# Patient Record
Sex: Male | Born: 1990 | Race: Black or African American | Hispanic: No | Marital: Single | Smoking: Never smoker
Health system: Southern US, Community
[De-identification: ages and names within clinical notes are randomized; demographics above are authoritative.]

## PROBLEM LIST (undated history)

## (undated) DIAGNOSIS — K219 Gastro-esophageal reflux disease without esophagitis: Secondary | ICD-10-CM

## (undated) DIAGNOSIS — J45909 Unspecified asthma, uncomplicated: Secondary | ICD-10-CM

## (undated) HISTORY — DX: Gastro-esophageal reflux disease without esophagitis: K21.9

## (undated) HISTORY — DX: Unspecified asthma, uncomplicated: J45.909

---
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
## 2010-04-21 ENCOUNTER — Ambulatory Visit: Payer: Self-pay | Admitting: Internal Medicine

## 2010-05-30 ENCOUNTER — Ambulatory Visit: Payer: Self-pay | Admitting: Internal Medicine

## 2010-05-30 DIAGNOSIS — M79609 Pain in unspecified limb: Secondary | ICD-10-CM

## 2010-08-21 NOTE — Letter (Signed)
Summary: Lipid Letter  [facility]  [address] [place], [place] [address]   Phone: [phone]  Fax: [phone]    12/02/2009  Alex Hernandez [address] [place], [place]  [address]  Dear Alex:  We have carefully reviewed your last lipid profile from 12/02/2009 and the results are noted below with a summary of recommendations for lipid management.    Cholesterol:       156     Goal: <200   HDL "good" Cholesterol:   13.24     Goal: >40   LDL "bad" Cholesterol:   98     Goal: <130   Triglycerides:       61.0     Goal: <150    excellent results    TLC Diet (Therapeutic Lifestyle Change): Saturated Fats & Transfatty acids should be kept < 7% of total calories ***Reduce Saturated Fats Polyunstaurated Fat can be up to 10% of total calories Monounsaturated Fat Fat can be up to 20% of total calories Total Fat should be no greater than 25-35% of total calories Carbohydrates should be 50-60% of total calories Protein should be approximately 15% of total calories Fiber should be at least 20-30 grams a day ***Increased fiber may help lower LDL Total Cholesterol should be < 200mg /day Consider adding plant stanol/sterols to diet (example: Benacol spread) ***A higher intake of unsaturated fat may reduce Triglycerides and Increase HDL    Adjunctive Measures (may lower LIPIDS and reduce risk of Heart Attack) include: Aerobic Exercise (20-30 minutes 3-4 times a week) Limit Alcohol Consumption Weight Reduction Aspirin 75-81 mg a day by mouth (if not allergic or contraindicated) Dietary Fiber 20-30 grams a day by mouth     Current Medications:  None If you have any questions, please call. We appreciate being able to work with you.   Sincerely,    [facility] Etta Grandchild MD

## 2010-08-21 NOTE — Assessment & Plan Note (Signed)
Summary: ONE MONTH FOLLOW UP-LB   Vital Signs:  Patient profile:   20 year old male Height:      68 inches Weight:      148 pounds BMI:     22.58 Temp:     99.2 degrees F oral Pulse rate:   72 / minute Pulse rhythm:   regular Resp:     16 per minute BP sitting:   102 / 70  (left arm) Cuff size:   regular  Vitals Entered By: Lanier Prude, CMA(AAMA) (May 30, 2010 3:04 PM) CC: 1 mo f/u c/o Rt knee/leg pain while driving X 1 mo Is Patient Diabetic? No   Primary Care Provider:  Etta Grandchild MD  CC:  1 mo f/u c/o Rt knee/leg pain while driving X 1 mo.  History of Present Illness: F/u GERD - better C/o pain on the outer side of the back of his knee worse w/driving x 1 month  Current Medications (verified): 1)  Vitamin D 1000 Unit Tabs (Cholecalciferol) .Marland Kitchen.. 1 By Mouth Qd 2)  Pantoprazole Sodium 40 Mg Tbec (Pantoprazole Sodium) .Marland Kitchen.. 1 By Mouth Once Daily For Indigestion  Allergies (verified): 1)  ! Penicillin  Past History:  Past Medical History: Last updated: 04/21/2010  GERD 2011  Social History: Last updated: 04/21/2010 Single Never Smoked Alcohol use-no Drug use-no Regular exercise-no Occupation:[employer] student  Review of Systems  The patient denies fever, chest pain, abdominal pain, and difficulty walking.    Physical Exam  General:  alert, well-developed, well-nourished, well-hydrated, appropriate dress, normal appearance, healthy-appearing, cooperative to examination, and good hygiene.   Mouth:  Oral mucosa and oropharynx without lesions or exudates.  Teeth in good repair. Lungs:  Normal respiratory effort, chest expands symmetrically. Lungs are clear to auscultation, no crackles or wheezes. Heart:  Normal rate and regular rhythm. S1 and S2 normal without gallop, murmur, click, rub or other extra sounds. Abdomen:  soft, non-tender, normal bowel sounds, no distention, no masses, no guarding, no rigidity, no rebound tenderness, no abdominal hernia, no  inguinal hernia, no hepatomegaly, and no splenomegaly.   Msk:  normal ROM, no joint tenderness, no joint swelling, no joint warmth, no redness over joints, no joint deformities, no joint instability, and no crepitation.  B knees NT Extremities:  No clubbing, cyanosis, edema, or deformity noted with normal full range of motion of all joints.   Neurologic:  No cranial nerve deficits noted. Station and gait are normal. Plantar reflexes are down-going bilaterally. DTRs are symmetrical throughout. Sensory, motor and coordinative functions appear intact. Skin:  turgor normal, color normal, no rashes, no suspicious lesions, no ecchymoses, no petechiae, no purpura, no ulcerations, and no edema.   Psych:  Cognition and judgment appear intact. Alert and cooperative with normal attention span and concentration. No apparent delusions, illusions, hallucinations   Impression & Recommendations:  Problem # 1:  GERD (ICD-530.81) Assessment Improved  His updated medication list for this problem includes:    Pantoprazole Sodium 40 Mg Tbec (Pantoprazole sodium) .Marland Kitchen... 1 by mouth once daily for indigestion EGD if worse  Problem # 2:  LEG PAIN (ICD-729.5) R IT band tightness Assessment: New See "Patient Instructions".  Pennsaid prn  Complete Medication List: 1)  Vitamin D 1000 Unit Tabs (Cholecalciferol) .Marland Kitchen.. 1 by mouth qd 2)  Pantoprazole Sodium 40 Mg Tbec (Pantoprazole sodium) .Marland Kitchen.. 1 by mouth once daily for indigestion 3)  Pennsaid 1.5 % Soln (Diclofenac sodium) .... 3-5 gtt on skin three times a day for pain  Patient Instructions: 1)  Go on Youtube (www.youtube.com) and look up  "IT band stretch" and "gluteus stretch". See the anatomy and learn the symptoms.  .Do the stretches - it may help!  2)  Please schedule a follow-up appointment in 6 months. Prescriptions: PENNSAID 1.5 % SOLN (DICLOFENAC SODIUM) 3-5 gtt on skin three times a day for pain  #1 x 3   Entered and Authorized by:   Tresa Garter  MD   Signed by:   Tresa Garter MD on 05/30/2010   Method used:   Print then Give to Patient   RxID:   [id number]    Orders Added: 1)  Est. Patient Level III [56213]

## 2010-08-21 NOTE — Assessment & Plan Note (Signed)
Summary: problem w/throat/cd   Vital Signs:  Patient profile:   20 year old male Height:      68 inches Weight:      146 pounds BMI:     22.28 O2 Sat:      98 % on Room air Temp:     98.2 degrees F oral Pulse rate:   62 / minute Pulse rhythm:   regular BP sitting:   106 / 70  (left arm) Cuff size:   large  Vitals Entered By: Rock Nephew CMA (April 21, 2010 4:53 PM)  O2 Flow:  Room air CC: Patient c/o throat swelling "hard to swallow " Is Patient Diabetic? No Pain Assessment Patient in pain? no        Primary Care Provider:  Etta Grandchild MD  CC:  Patient c/o throat swelling "hard to swallow ".  History of Present Illness: C/o acid making his throat closing when he is feeling hungry almost every day since may - getting worse OTC Prilosec has helped some. No food allergies noted. No mouth/lips swelling; no SOB.  Current Medications (verified): 1)  None  Allergies (verified): 1)  ! Penicillin  Past History:  Past Medical History:  GERD 2011  Social History: Single Never Smoked Alcohol use-no Drug use-no Regular exercise-no Occupation:[employer] student  Review of Systems  The patient denies anorexia, fever, weight loss, and chest pain.    Physical Exam  General:  alert, well-developed, well-nourished, well-hydrated, appropriate dress, normal appearance, healthy-appearing, cooperative to examination, and good hygiene.   Mouth:  Oral mucosa and oropharynx without lesions or exudates.  Teeth in good repair. Neck:  supple, full ROM, no masses, no thyromegaly, no thyroid nodules or tenderness, no JVD, normal carotid upstroke, no carotid bruits, no cervical lymphadenopathy, and no neck tenderness.   Lungs:  Normal respiratory effort, chest expands symmetrically. Lungs are clear to auscultation, no crackles or wheezes. Heart:  Normal rate and regular rhythm. S1 and S2 normal without gallop, murmur, click, rub or other extra sounds. Abdomen:  soft, non-tender,  normal bowel sounds, no distention, no masses, no guarding, no rigidity, no rebound tenderness, no abdominal hernia, no inguinal hernia, no hepatomegaly, and no splenomegaly.     Impression & Recommendations:  Problem # 1:  GERD (ICD-530.81) causing throat discomfort Assessment New RTC 1 month Labs/tests if needed then His updated medication list for this problem includes:    Pantoprazole Sodium 40 Mg Tbec (Pantoprazole sodium) .Marland Kitchen... 1 by mouth once daily for indigestion  Complete Medication List: 1)  Vitamin D 1000 Unit Tabs (Cholecalciferol) .Marland Kitchen.. 1 by mouth qd 2)  Pantoprazole Sodium 40 Mg Tbec (Pantoprazole sodium) .Marland Kitchen.. 1 by mouth once daily for indigestion  Patient Instructions: 1)  Please schedule a follow-up appointment in 1 month Dr Yetta Barre. Prescriptions: PANTOPRAZOLE SODIUM 40 MG TBEC (PANTOPRAZOLE SODIUM) 1 by mouth once daily for indigestion  #30 x 12   Entered and Authorized by:   Tresa Garter MD   Signed by:   Tresa Garter MD on 04/21/2010   Method used:   Print then Give to Patient   RxID:   [id number]

## 2010-08-21 NOTE — Assessment & Plan Note (Signed)
Summary: NEW [employer] PT--PKG-#-STC - mom called and changed appt to CPX/r...   Vital Signs:  Patient profile:   20 year old male Height:      68 inches Weight:      142 pounds BMI:     21.67 O2 Sat:      97 % on Room air Temp:     98.1 degrees F oral Pulse rate:   57 / minute Pulse rhythm:   regular Resp:     16 per minute BP sitting:   120 / 76  (left arm) Cuff size:   large  Vitals Entered By: Rock Nephew CMA (November 15, 2009 3:50 PM)  O2 Flow:  Room air CC: new to establish Is Patient Diabetic? No Pain Assessment Patient in pain? no        Primary Care Provider:  Etta Grandchild MD  CC:  new to establish.  History of Present Illness: New to me for a complete physical. No complaints.  Preventive Screening-Counseling & Management  Alcohol-Tobacco     Alcohol drinks/day: 0     Smoking Status: never  Caffeine-Diet-Exercise     Does Patient Exercise: no  Hep-HIV-STD-Contraception     Hepatitis Risk: no risk noted     HIV Risk: no risk noted     STD Risk: no risk noted     TSE monthly: yes     Testicular SE Education/Counseling to perform regular STE      Sexual History:  Not active.        Drug Use:  no.        Blood Transfusions:  no.    Current Medications (verified): 1)  None  Allergies (verified): 1)  ! Penicillin  Past History:  Past Medical History: Unremarkable  Past Surgical History: Denies surgical history  Family History: none  Social History: Reviewed history and no changes required. Single Never Smoked Alcohol use-no Drug use-no Regular exercise-no Smoking Status:  never Hepatitis Risk:  no risk noted HIV Risk:  no risk noted STD Risk:  no risk noted Sexual History:  Not active Blood Transfusions:  no Drug Use:  no Does Patient Exercise:  no  Review of Systems  The patient denies anorexia, fever, weight loss, weight gain, chest pain, syncope, peripheral edema, prolonged cough, headaches, abdominal pain, suspicious  skin lesions, difficulty walking, depression, enlarged lymph nodes, angioedema, and testicular masses.    Physical Exam  General:  alert, well-developed, well-nourished, well-hydrated, appropriate dress, normal appearance, healthy-appearing, cooperative to examination, and good hygiene.   Head:  normocephalic, atraumatic, no abnormalities observed, and no abnormalities palpated.   Eyes:  vision grossly intact, pupils equal, pupils round, and pupils reactive to light.   Mouth:  Oral mucosa and oropharynx without lesions or exudates.  Teeth in good repair. Neck:  supple, full ROM, no masses, no thyromegaly, no thyroid nodules or tenderness, no JVD, normal carotid upstroke, no carotid bruits, no cervical lymphadenopathy, and no neck tenderness.   Chest Wall:  no deformities, no tenderness, and no masses.   Breasts:  No masses or gynecomastia noted Lungs:  Normal respiratory effort, chest expands symmetrically. Lungs are clear to auscultation, no crackles or wheezes. Heart:  Normal rate and regular rhythm. S1 and S2 normal without gallop, murmur, click, rub or other extra sounds. Abdomen:  soft, non-tender, normal bowel sounds, no distention, no masses, no guarding, no rigidity, no rebound tenderness, no abdominal hernia, no inguinal hernia, no hepatomegaly, and no splenomegaly.   Genitalia:  circumcised, no hydrocele, no varicocele, no scrotal masses, no testicular masses or atrophy, no cutaneous lesions, and no urethral discharge.   Msk:  normal ROM, no joint tenderness, no joint swelling, no joint warmth, no redness over joints, no joint deformities, no joint instability, and no crepitation.   Pulses:  R and L carotid,radial,femoral,dorsalis pedis and posterior tibial pulses are full and equal bilaterally Extremities:  No clubbing, cyanosis, edema, or deformity noted with normal full range of motion of all joints.   Neurologic:  No cranial nerve deficits noted. Station and gait are normal. Plantar  reflexes are down-going bilaterally. DTRs are symmetrical throughout. Sensory, motor and coordinative functions appear intact. Skin:  turgor normal, color normal, no rashes, no suspicious lesions, no ecchymoses, no petechiae, no purpura, no ulcerations, and no edema.   Cervical Nodes:  no anterior cervical adenopathy and no posterior cervical adenopathy.   Axillary Nodes:  no R axillary adenopathy and no L axillary adenopathy.   Inguinal Nodes:  no R inguinal adenopathy and no L inguinal adenopathy.   Psych:  Cognition and judgment appear intact. Alert and cooperative with normal attention span and concentration. No apparent delusions, illusions, hallucinations   Impression & Recommendations:  Problem # 1:  ROUTINE GENERAL MEDICAL EXAM@HEALTH  CARE FACL (ICD-V70.0) Assessment New  Orders: Venipuncture (59563) TLB-Lipid Panel (80061-LIPID)  Td Booster: Tdap (03/07/2008)    Discussed using sunscreen, use of alcohol, drug use, self testicular exam, routine dental care, routine eye care, routine physical exam, seat belts, multiple vitamins, and recommendations for immunizations.  Discussed exercise and checking cholesterol.  Discussed gun safety and safe sex.  Patient Instructions: 1)  Please schedule a follow-up appointment as needed. 2)  If you could be exposed to sexually transmitted diseases, you should use a condom.    Tetanus/Td Immunization History:    Tetanus/Td # 1:  Tdap (03/07/2008)

## 2010-10-05 LAB — DIFFERENTIAL
Basophils Absolute: 0 10*3/uL (ref 0.0–0.1)
Eosinophils Absolute: 0 10*3/uL (ref 0.0–0.7)
Eosinophils Relative: 0 % (ref 0–5)
Lymphs Abs: 0.2 10*3/uL — ABNORMAL LOW (ref 0.7–4.0)
Monocytes Absolute: 0.6 10*3/uL (ref 0.1–1.0)

## 2010-10-05 LAB — CBC
HCT: 45 % (ref 39.0–52.0)
Hemoglobin: 15.5 g/dL (ref 13.0–17.0)
MCHC: 34.4 g/dL (ref 30.0–36.0)
MCV: 89.9 fL (ref 78.0–100.0)
Platelets: 129 10*3/uL — ABNORMAL LOW (ref 150–400)
RBC: 5.01 MIL/uL (ref 4.22–5.81)
RDW: 12.1 % (ref 11.5–15.5)
WBC: 7.7 10*3/uL (ref 4.0–10.5)

## 2010-10-05 LAB — BASIC METABOLIC PANEL
BUN: 15 mg/dL (ref 6–23)
CO2: 22 mEq/L (ref 19–32)
Calcium: 9.1 mg/dL (ref 8.4–10.5)
Chloride: 104 mEq/L (ref 96–112)
Creatinine, Ser: 0.98 mg/dL (ref 0.4–1.5)
GFR calc Af Amer: >60 mL/min (ref >60)
GFR calc non Af Amer: >60 mL/min (ref >60)
Glucose, Bld: 115 mg/dL — ABNORMAL HIGH (ref 70–99)
Potassium: 4 mEq/L (ref 3.5–5.1)
Sodium: 138 mEq/L (ref 135–145)

## 2012-09-13 ENCOUNTER — Encounter: Payer: Self-pay | Admitting: Internal Medicine

## 2012-09-13 ENCOUNTER — Ambulatory Visit (INDEPENDENT_AMBULATORY_CARE_PROVIDER_SITE_OTHER): Payer: Federal, State, Local not specified - PPO | Admitting: Internal Medicine

## 2012-09-13 ENCOUNTER — Other Ambulatory Visit: Payer: Self-pay | Admitting: *Deleted

## 2012-09-13 ENCOUNTER — Other Ambulatory Visit (INDEPENDENT_AMBULATORY_CARE_PROVIDER_SITE_OTHER): Payer: Federal, State, Local not specified - PPO

## 2012-09-13 VITALS — BP 122/80 | HR 76 | Temp 98.3°F | Resp 16 | Wt 146.0 lb

## 2012-09-13 DIAGNOSIS — R1310 Dysphagia, unspecified: Secondary | ICD-10-CM

## 2012-09-13 DIAGNOSIS — K219 Gastro-esophageal reflux disease without esophagitis: Secondary | ICD-10-CM

## 2012-09-13 LAB — CBC WITH DIFFERENTIAL/PLATELET
Basophils Relative: 0.3 % (ref 0.0–3.0)
Eosinophils Relative: 0.7 % (ref 0.0–5.0)
Lymphocytes Relative: 22.3 % (ref 12.0–46.0)
MCV: 88.9 fl (ref 78.0–100.0)
Monocytes Absolute: 0.4 10*3/uL (ref 0.1–1.0)
Monocytes Relative: 5.8 % (ref 3.0–12.0)
Neutrophils Relative %: 70.9 % (ref 43.0–77.0)
Platelets: 160 10*3/uL (ref 150.0–400.0)
RBC: 4.67 Mil/uL (ref 4.22–5.81)
WBC: 6.8 10*3/uL (ref 4.5–10.5)

## 2012-09-13 LAB — HEPATIC FUNCTION PANEL
ALT: 19 U/L (ref 0–53)
AST: 25 U/L (ref 0–37)
Albumin: 4.3 g/dL (ref 3.5–5.2)
Total Bilirubin: 0.7 mg/dL (ref 0.3–1.2)

## 2012-09-13 LAB — H. PYLORI ANTIBODY, IGG: H Pylori IgG: NEGATIVE

## 2012-09-13 MED ORDER — ESOMEPRAZOLE MAGNESIUM 40 MG PO CPDR: 40.0000 mg | DELAYED_RELEASE_CAPSULE | Freq: Every day | ORAL | Status: DC

## 2012-09-13 MED ORDER — VITAMIN D 1000 UNITS PO TABS: 1000.0000 [IU] | ORAL_TABLET | Freq: Every day | ORAL | Status: AC

## 2012-09-13 NOTE — Progress Notes (Signed)
  Subjective:    Patient ID: Alex Hernandez, male    DOB: 12-25-1990, 22 y.o.   MRN: [id number]  Gastrophageal Reflux He complains of belching, chest pain, choking, dysphagia and nausea. He reports no abdominal pain or no wheezing. This is a new problem. The current episode started in the past 7 days. The problem occurs frequently. The problem has been unchanged. The symptoms are aggravated by certain foods and lying down. Pertinent negatives include no fatigue. He has tried an antacid and a PPI for the symptoms. The treatment provided mild relief. Past procedures do not include an abdominal ultrasound or an EGD.      Review of Systems  Constitutional: Negative for fatigue and unexpected weight change.  Respiratory: Positive for choking. Negative for wheezing.   Cardiovascular: Positive for chest pain.  Gastrointestinal: Positive for dysphagia and nausea. Negative for vomiting, abdominal pain, diarrhea and abdominal distention.  Genitourinary: Negative for dysuria.  Allergic/Immunologic: Negative for food allergies.  Neurological: Negative for seizures and light-headedness.       Objective:   Physical Exam  Constitutional: He is oriented to person, place, and time. He appears well-developed.  HENT:  Mouth/Throat: Oropharynx is clear and moist.  Eyes: Conjunctivae are normal. Pupils are equal, round, and reactive to light.  Neck: Normal range of motion. No JVD present. No thyromegaly present.  Cardiovascular: Normal rate, regular rhythm, normal heart sounds and intact distal pulses.  Exam reveals no gallop and no friction rub.   No murmur heard. Pulmonary/Chest: Effort normal and breath sounds normal. No respiratory distress. He has no wheezes. He has no rales. He exhibits no tenderness.  Abdominal: Soft. Bowel sounds are normal. He exhibits no distension and no mass. There is no tenderness. There is no rebound and no guarding.  Musculoskeletal: Normal range of motion. He exhibits no  edema and no tenderness.  Lymphadenopathy:    He has no cervical adenopathy.  Neurological: He is alert and oriented to person, place, and time. He has normal reflexes. No cranial nerve deficit. He exhibits normal muscle tone. Coordination normal.  Skin: Skin is warm and dry. No rash noted.  Psychiatric: He has a normal mood and affect. His behavior is normal. Judgment and thought content normal.          Assessment & Plan:

## 2012-09-13 NOTE — Assessment & Plan Note (Signed)
Nexium bid GI consult Dr Eligha Bridegroom Diet

## 2012-09-13 NOTE — Progress Notes (Deleted)
Patient ID: Alex Hernandez, male   DOB: 12-16-1990, 22 y.o.   MRN: [id number]

## 2012-09-13 NOTE — Assessment & Plan Note (Signed)
Nexium bid GI consult Dr Kaplan Labs Diet 

## 2012-10-10 ENCOUNTER — Telehealth: Payer: Self-pay | Admitting: Internal Medicine

## 2012-10-10 MED ORDER — ESOMEPRAZOLE MAGNESIUM 40 MG PO CPDR: 40.0000 mg | DELAYED_RELEASE_CAPSULE | Freq: Every day | ORAL | Status: DC

## 2012-10-10 NOTE — Telephone Encounter (Signed)
Done

## 2012-10-10 NOTE — Telephone Encounter (Signed)
Pt req written prescription for Nexium 40mg  to be send to [facility]. Pt stated Dr, Macario Golds gave him sample in the past. Please call pt if this ok.

## 2013-06-19 ENCOUNTER — Encounter: Payer: Self-pay | Admitting: Internal Medicine

## 2013-06-19 ENCOUNTER — Ambulatory Visit (INDEPENDENT_AMBULATORY_CARE_PROVIDER_SITE_OTHER): Payer: Federal, State, Local not specified - PPO | Admitting: Internal Medicine

## 2013-06-19 VITALS — BP 120/90 | HR 90 | Temp 98.1°F | Wt 151.4 lb

## 2013-06-19 DIAGNOSIS — K209 Esophagitis, unspecified without bleeding: Secondary | ICD-10-CM

## 2013-06-19 MED ORDER — SUCRALFATE 1 GM/10ML PO SUSP: 1.0000 g | Freq: Three times a day (TID) | ORAL | Status: DC

## 2013-06-19 NOTE — Progress Notes (Signed)
Pre visit review using our clinic review tool, if applicable. No additional management support is needed unless otherwise documented below in the visit note. 

## 2013-06-19 NOTE — Patient Instructions (Signed)
Esophagitis - no stomach tenderness and not controlled by Nexium  Plan Continue nexium 40 mg every AM before breakfast  Carafate suspension (shake the bottle) 10 cc before meals and bedtime  Referral to gastroenterology for further evaluation of esophagitis   Esophagitis Esophagitis is inflammation of the esophagus. It can involve swelling, soreness, and pain in the esophagus. This condition can make it difficult and painful to swallow. CAUSES  Most causes of esophagitis are not serious. Many different factors can cause esophagitis, including:  Gastroesophageal reflux disease (GERD). This is when acid from your stomach flows up into the esophagus.  Recurrent vomiting.  An allergic-type reaction.  Certain medicines, especially those that come in large pills.  Ingestion of harmful chemicals, such as household cleaning products.  Heavy alcohol use.  An infection of the esophagus.  Radiation treatment for cancer.  Certain diseases such as sarcoidosis, Crohn's disease, and scleroderma. These diseases may cause recurrent esophagitis. SYMPTOMS   Trouble swallowing.  Painful swallowing.  Chest pain.  Difficulty breathing.  Nausea.  Vomiting.  Abdominal pain. DIAGNOSIS  Your caregiver will take your history and do a physical exam. Depending upon what your caregiver finds, certain tests may also be done, including:  Barium X-ray. You will drink a solution that coats the esophagus, and X-rays will be taken.  Endoscopy. A lighted tube is put down the esophagus so your caregiver can examine the area.  Allergy tests. These can sometimes be arranged through follow-up visits. TREATMENT  Treatment will depend on the cause of your esophagitis. In some cases, steroids or other medicines may be given to help relieve your symptoms or to treat the underlying cause of your condition. Medicines that may be recommended include:  Viscous lidocaine, to soothe the  esophagus.  Antacids.  Acid reducers.  Proton pump inhibitors.  Antiviral medicines for certain viral infections of the esophagus.  Antifungal medicines for certain fungal infections of the esophagus.  Antibiotic medicines, depending on the cause of the esophagitis. HOME CARE INSTRUCTIONS   Avoid foods and drinks that seem to make your symptoms worse.  Eat small, frequent meals instead of large meals.  Avoid eating for the 3 hours prior to your bedtime.  If you have trouble taking pills, use a pill splitter to decrease the size and likelihood of the pill getting stuck or injuring the esophagus on the way down. Drinking water after taking a pill also helps.  Stop smoking if you smoke.  Maintain a healthy weight.  Wear loose-fitting clothing. Do not wear anything tight around your waist that causes pressure on your stomach.  Raise the head of your bed 6 to 8 inches with wood blocks to help you sleep. Extra pillows will not help.  Only take over-the-counter or prescription medicines as directed by your caregiver. SEEK IMMEDIATE MEDICAL CARE IF:  You have severe chest pain that radiates into your arm, neck, or jaw.  You feel sweaty, dizzy, or lightheaded.  You have shortness of breath.  You vomit blood.  You have difficulty or pain with swallowing.  You have bloody or black, tarry stools.  You have a fever.  You have a burning sensation in the chest more than 3 times a week for more than 2 weeks.  You cannot swallow, drink, or eat.  You drool because you cannot swallow your saliva. MAKE SURE YOU:  Understand these instructions.  Will watch your condition.  Will get help right away if you are not doing well or get worse. Document  Released: 08/13/2004 Document Revised: 09/28/2011 Document Reviewed: 03/06/2011 [facility] Patient Information 2014 [place], [place].

## 2013-06-19 NOTE — Progress Notes (Signed)
   Subjective:    Patient ID: Alex Hernandez, male    DOB: 03-09-1991, 22 y.o.   MRN: [id number]  HPI Alex reports that he is having bad reflux symptoms with substernal pain and hot water brash, no dysphagia. He has symptoms 30 minutes after eating. Increased eructation. One episode vomiting w/o coffee grounds. No change in bowel habit: no black or tarry stools. Hew has taken nexium which helped initially but is not controlling symptoms. He has not seen gastroenterologist.   History reviewed. No pertinent past medical history. History reviewed. No pertinent past surgical history. Family History  Problem Relation Age of Onset  . Cholecystitis Mother   . Cholecystitis Maternal Aunt    History   Social History  . Marital Status: Single    Spouse Name: N/A    Number of Children: N/A  . Years of Education: N/A   Occupational History  . Not on file.   Social History Main Topics  . Smoking status: Never Smoker   . Smokeless tobacco: Not on file  . Alcohol Use: No  . Drug Use: No  . Sexual Activity: Yes   Other Topics Concern  . Not on file   Social History Narrative  . No narrative on file    Current Outpatient Prescriptions on File Prior to Visit  Medication Sig Dispense Refill  . esomeprazole (NEXIUM) 40 MG capsule Take 1 capsule (40 mg total) by mouth daily.  30 capsule  11  . cholecalciferol (VITAMIN D) 1000 UNITS tablet Take 1 tablet (1,000 Units total) by mouth daily.  100 tablet  3   No current facility-administered medications on file prior to visit.      Review of Systems System review is negative for any constitutional, cardiac, pulmonary, GI or neuro symptoms or complaints other than as described in the HPI.     Objective:   Physical Exam Filed Vitals:   06/19/13 1656  BP: 120/90  Pulse: 90  Temp: 98.1 F (36.7 C)   Gen'l- slender man in no acute distress HEENT_ C&S clear Cor 2+ radial pulse, RRR PUlm - normal respirations ABd - BS + x 4, good  muscle tone with poor relaxation. No tenderness to percussion or deep palpation. Neuro - non focal       Assessment & Plan:  Esophagitis - no stomach tenderness and not controlled by Nexium  Plan Continue nexium 40 mg every AM before breakfast  Carafate suspension (shake the bottle) 10 cc before meals and bedtime  Referral to gastroenterology for further evaluation of esophagitis

## 2013-06-20 ENCOUNTER — Encounter: Payer: Self-pay | Admitting: Internal Medicine

## 2013-07-12 ENCOUNTER — Ambulatory Visit: Payer: Federal, State, Local not specified - PPO | Admitting: Gastroenterology

## 2013-07-18 ENCOUNTER — Encounter: Payer: Self-pay | Admitting: Internal Medicine

## 2013-07-18 ENCOUNTER — Ambulatory Visit (INDEPENDENT_AMBULATORY_CARE_PROVIDER_SITE_OTHER): Payer: Federal, State, Local not specified - PPO | Admitting: Internal Medicine

## 2013-07-18 VITALS — BP 100/50 | HR 84 | Ht 67.5 in | Wt 154.2 lb

## 2013-07-18 DIAGNOSIS — K594 Anal spasm: Secondary | ICD-10-CM

## 2013-07-18 DIAGNOSIS — K219 Gastro-esophageal reflux disease without esophagitis: Secondary | ICD-10-CM

## 2013-07-18 MED ORDER — SUCRALFATE 1 GM/10ML PO SUSP: 1.0000 g | Freq: Three times a day (TID) | ORAL | Status: DC

## 2013-07-18 NOTE — Patient Instructions (Signed)
We sent refills for Carafate to [facility].  You have been scheduled for an endoscopy with propofol. Please follow written instructions given to you at your visit today. If you use inhalers (even only as needed), please bring them with you on the day of your procedure.   Ask for a work note from the nurse the day you have your procedure before you are discharged from the unit upstairs.

## 2013-07-18 NOTE — Progress Notes (Signed)
07/18/2013 [place] [id number] Mar 10, 1991   History of Present Illness:  This is a 22 year old male who is new to our practice and has been referred here by his PCP for issues with acid reflux. He states that he's had problems with this on and off since 2011. He has been taking Nexium on and off for several months at a time, however, will discontinue the medication after he has been feeling well for a while. He was taking Nexium up until March or April of this year but discontinued it again at that time. He did well until Thanksgiving when he some spicy food, which he thinks triggered his symptoms again. When he has reflux he gets a lot of pressure in his upper abdomen and gets a globus sensation and feels like he can't swallow. He's even vomited on occasion.  When the last episode occurred at Thanksgiving he saw his PCP who placed him back on the Nexium 40 mg daily along with Carafate suspension 4 times a day. The medications have helped and he is feeling well currently. They're concerned with the ongoing and severe issues. He does state that the Carafate suspension is expensive and is hard to take 4 times a day.  H. pylori IgG was negative.  He also complains of intermittent, transient, sharp pains in his rectum that occur about 6 or 7 times a year. They only last a short time before going away.  His bowel movements are regular and he denies seeing any blood. His mom states that she has the same issue with the intermittent pains in her rectum as well, but only has hemorrhoids.    Current Medications, Allergies, Past Medical History, Past Surgical History, Family History and Social History were reviewed in [employer] record.   Physical Exam: BP 100/50  Pulse 84  Ht 5' 7.5" (1.715 m)  Wt 154 lb 4 oz (69.967 kg)  BMI 23.79 kg/m2 General: Well developed black male in no acute distress Head: Normocephalic and atraumatic Eyes:  Sclerae anicteric, conjunctiva pink   Ears: Normal auditory acuity Lungs: Clear throughout to auscultation Heart: Regular rate and rhythm Abdomen: Soft, non-distended. No masses, no hepatomegaly. Normal bowel sounds.  Non-tender. Rectal:  No hemorrhoids or masses noted.  No pain on DRE. Musculoskeletal: Symmetrical with no gross deformities  Extremities: No edema  Neurological: Alert oriented x 4, grossly non-focal Psychological:  Alert and cooperative. Normal mood and affect  Assessment and Recommendations: -GERD:  Symptoms improved and resolved on Nexium 40 mg daily along with Carafate 4 times a day. He did well with Nexium alone in the past. His Carafate is almost gone and he reports that it is expensive and difficult to take 4 times a day. We will give him some samples if we have some and he can decrease the use to twice a day and then discontinue it if he feels comfortable. We will schedule him for an EGD to rule out other issues. He will follow reflux dietary measures as well. -Proctalgia fugax:  Experiences intermittent, transient, sharp pains in his rectum 6 or 7 times per year. Rectal exam negative for any masses or hemorrhoids.

## 2013-07-18 NOTE — Progress Notes (Signed)
Case reviewed in detail with extender. Agree with initial assessment and plans

## 2013-07-26 ENCOUNTER — Encounter: Payer: Self-pay | Admitting: Internal Medicine

## 2013-07-27 ENCOUNTER — Telehealth: Payer: Self-pay | Admitting: *Deleted

## 2013-07-27 NOTE — Telephone Encounter (Signed)
Prior auth initiated for Nexium 40mg . Approved 11.8.14-1.8.16.

## 2013-08-03 ENCOUNTER — Encounter: Payer: Self-pay | Admitting: Internal Medicine

## 2013-08-03 ENCOUNTER — Ambulatory Visit (AMBULATORY_SURGERY_CENTER): Payer: Federal, State, Local not specified - PPO | Admitting: Internal Medicine

## 2013-08-03 VITALS — BP 105/68 | HR 74 | Temp 97.5°F | Resp 17 | Ht 67.0 in | Wt 154.0 lb

## 2013-08-03 DIAGNOSIS — K219 Gastro-esophageal reflux disease without esophagitis: Secondary | ICD-10-CM

## 2013-08-03 MED ORDER — SODIUM CHLORIDE 0.9 % IV SOLN: 500.0000 mL | INTRAVENOUS | Status: DC

## 2013-08-03 NOTE — Progress Notes (Signed)
No complaints noted in the recovery room. Maw   

## 2013-08-03 NOTE — Patient Instructions (Signed)

## 2013-08-03 NOTE — Op Note (Signed)
[facility] [address] [place], [id number]   ENDOSCOPY PROCEDURE REPORT  PATIENT: Alex Hernandez, Alex N.  MR#: [id number] BIRTHDATE: 16-Jul-1991 , 22  yrs. old GENDER: Male ENDOSCOPIST: Roxy CedarJohn N Myleah Cavendish Jr, MD REFERRED BY:  Janeal HolmesAlex V Plotnikov, M.D. PROCEDURE DATE:  08/03/2013 PROCEDURE:  EGD, diagnostic ASA CLASS:     Class I INDICATIONS:  History of esophageal reflux. MEDICATIONS: MAC sedation, administered by CRNA and propofol (Diprivan) 270mg  IV TOPICAL ANESTHETIC: Cetacaine Spray  DESCRIPTION OF PROCEDURE: After the risks benefits and alternatives of the procedure were thoroughly explained, informed consent was obtained.  The LB [id number] endoscope was introduced through the mouth and advanced to the second portion of the duodenum. Without limitations.  The instrument was slowly withdrawn as the mucosa was fully examined.      EXAMThe upper, middle and distal third of the esophagus were carefully inspected and no abnormalities were noted.  The z-line was well seen at the GEJ.  The endoscope was pushed into the fundus which was normal including a retroflexed view.  The antrum, gastric body, first and second part of the duodenum were unremarkable. Retroflexed views revealed no abnormalities.     The scope was then withdrawn from the patient and the procedure completed.  COMPLICATIONS: There were no complications. ENDOSCOPIC IMPRESSION: 1. Normal EGD 2. GERD  RECOMMENDATIONS: 1.  Anti-reflux regimen to be followed 2.  Continue PPI (currently using Nexium). Lowest dose to control symptoms would be appropriate  REPEAT EXAM:  eSigned:  Roxy CedarJohn N Kasheena Sambrano Jr, MD 08/03/2013 4:32 PM   DG:UYQICC:Alex V.  Plotnikov, MD and The Patient

## 2013-08-03 NOTE — Progress Notes (Signed)
A/ox3 pleased with MAC, report to Annette RN 

## 2013-08-04 ENCOUNTER — Telehealth: Payer: Self-pay | Admitting: *Deleted

## 2013-08-04 NOTE — Telephone Encounter (Signed)
  Follow up Call-  Call back number 08/03/2013  Post procedure Call Back phone  # [phone]  Permission to leave phone message Yes     Patient questions:  Do you have a fever, pain , or abdominal swelling? no Pain Score  0 *  Have you tolerated food without any problems? yes  Have you been able to return to your normal activities? yes  Do you have any questions about your discharge instructions: Diet   no Medications  no Follow up visit  no  Do you have questions or concerns about your Care? no  Actions: * If pain score is 4 or above: No action needed, pain <4.

## 2013-08-10 ENCOUNTER — Telehealth: Payer: Self-pay | Admitting: *Deleted

## 2013-08-10 NOTE — Telephone Encounter (Signed)
Yes. However, it is better to include more good protein rich foods in your diet. Do not use soy protein. Thx

## 2013-08-10 NOTE — Telephone Encounter (Signed)
Pt called requesting whether he can start a Protein supplement to be taken twice daily.  Please advise

## 2013-08-11 NOTE — Telephone Encounter (Signed)
Left detailed message on pts VM. 

## 2014-01-05 ENCOUNTER — Telehealth: Payer: Self-pay | Admitting: Internal Medicine

## 2014-01-05 NOTE — Telephone Encounter (Signed)
Patient is calling to see if he can come in to have a TB skin test for employment reasons. Please advise if okay. Last office visit 09/13/2012

## 2014-01-05 NOTE — Telephone Encounter (Signed)
OV scheduled.  °

## 2014-01-05 NOTE — Telephone Encounter (Signed)
Yes, needs to see PCP for OV.

## 2014-01-22 ENCOUNTER — Ambulatory Visit: Payer: Federal, State, Local not specified - PPO | Admitting: Internal Medicine

## 2014-01-22 DIAGNOSIS — Z0289 Encounter for other administrative examinations: Secondary | ICD-10-CM

## 2014-01-25 ENCOUNTER — Telehealth: Payer: Self-pay | Admitting: Internal Medicine

## 2014-01-25 NOTE — Telephone Encounter (Signed)
Patient no showed for FU on Monday 7/6.  As of right now there are no other standing appts with Dr. Posey ReaPlotnikov.  Please advise.  Thanks!

## 2014-01-26 NOTE — Telephone Encounter (Signed)
Noted  

## 2014-08-16 ENCOUNTER — Encounter (HOSPITAL_COMMUNITY): Payer: Self-pay

## 2014-08-16 ENCOUNTER — Emergency Department (HOSPITAL_COMMUNITY)
Admission: EM | Admit: 2014-08-16 | Discharge: 2014-08-16 | Disposition: A | Discharge status: Home / Self Care | Payer: Federal, State, Local not specified - PPO | Attending: Emergency Medicine | Admitting: Emergency Medicine

## 2014-08-16 ENCOUNTER — Emergency Department (HOSPITAL_COMMUNITY): Payer: Federal, State, Local not specified - PPO

## 2014-08-16 DIAGNOSIS — R1013 Epigastric pain: Secondary | ICD-10-CM | POA: Diagnosis present

## 2014-08-16 DIAGNOSIS — Z880 Allergy status to penicillin: Secondary | ICD-10-CM | POA: Diagnosis not present

## 2014-08-16 DIAGNOSIS — K219 Gastro-esophageal reflux disease without esophagitis: Secondary | ICD-10-CM | POA: Insufficient documentation

## 2014-08-16 DIAGNOSIS — R1011 Right upper quadrant pain: Secondary | ICD-10-CM | POA: Insufficient documentation

## 2014-08-16 DIAGNOSIS — Z79899 Other long term (current) drug therapy: Secondary | ICD-10-CM | POA: Diagnosis not present

## 2014-08-16 LAB — COMPREHENSIVE METABOLIC PANEL
ALT: 22 U/L (ref 0–53)
AST: 25 U/L (ref 0–37)
Albumin: 4.1 g/dL (ref 3.5–5.2)
Alkaline Phosphatase: 49 U/L (ref 39–117)
Anion gap: 4 — ABNORMAL LOW (ref 5–15)
BILIRUBIN TOTAL: 0.7 mg/dL (ref 0.3–1.2)
BUN: 8 mg/dL (ref 6–23)
CALCIUM: 9.3 mg/dL (ref 8.4–10.5)
CO2: 32 mmol/L (ref 19–32)
CREATININE: 1.14 mg/dL (ref 0.50–1.35)
Chloride: 102 mmol/L (ref 96–112)
GFR calc non Af Amer: 89 mL/min — ABNORMAL LOW (ref >90)
Glucose, Bld: 105 mg/dL — ABNORMAL HIGH (ref 70–99)
Potassium: 4.1 mmol/L (ref 3.5–5.1)
SODIUM: 138 mmol/L (ref 135–145)
Total Protein: 7 g/dL (ref 6.0–8.3)

## 2014-08-16 LAB — CBC WITH DIFFERENTIAL/PLATELET
BASOS PCT: 0 % (ref 0–1)
Basophils Absolute: 0 10*3/uL (ref 0.0–0.1)
EOS ABS: 0.1 10*3/uL (ref 0.0–0.7)
Eosinophils Relative: 1 % (ref 0–5)
HEMATOCRIT: 41.6 % (ref 39.0–52.0)
HEMOGLOBIN: 14.3 g/dL (ref 13.0–17.0)
LYMPHS PCT: 28 % (ref 12–46)
Lymphs Abs: 2 10*3/uL (ref 0.7–4.0)
MCH: 29.8 pg (ref 26.0–34.0)
MCHC: 34.4 g/dL (ref 30.0–36.0)
MCV: 86.7 fL (ref 78.0–100.0)
Monocytes Absolute: 0.3 10*3/uL (ref 0.1–1.0)
Monocytes Relative: 5 % (ref 3–12)
NEUTROS PCT: 66 % (ref 43–77)
Neutro Abs: 4.6 10*3/uL (ref 1.7–7.7)
PLATELETS: 174 10*3/uL (ref 150–400)
RBC: 4.8 MIL/uL (ref 4.22–5.81)
RDW: 11.8 % (ref 11.5–15.5)
WBC: 7 10*3/uL (ref 4.0–10.5)

## 2014-08-16 LAB — LIPASE, BLOOD: Lipase: 23 U/L (ref 11–59)

## 2014-08-16 MED ORDER — ESOMEPRAZOLE MAGNESIUM 40 MG PO CPDR: 40.0000 mg | DELAYED_RELEASE_CAPSULE | Freq: Every day | ORAL | Status: DC

## 2014-08-16 MED ORDER — GI COCKTAIL ~~LOC~~
30.0000 mL | Freq: Once | ORAL | Status: AC
  Administered 2014-08-16: 30 mL via ORAL
  Filled 2014-08-16: qty 30

## 2014-08-16 NOTE — ED Notes (Signed)
Pt from home with RUQ abdominal pain that started today after he was eating.  Sts he ate french fries and then pain started.  Denies nausea, vomiting.

## 2014-08-16 NOTE — ED Provider Notes (Signed)
CSN: [id number]     Arrival date & time 08/16/14  1530 History   First MD Initiated Contact with Patient 08/16/14 1601     Chief Complaint  Patient presents with  . Abdominal Pain     (Consider location/radiation/quality/duration/timing/severity/associated sxs/prior Treatment) Patient is a 24 y.o. male presenting with abdominal pain. The history is provided by the patient.  Abdominal Pain Pain location:  Epigastric and RUQ Pain quality: pressure and sharp   Pain radiates to:  Does not radiate Pain severity:  Moderate Onset quality:  Sudden Duration: 30 min. Timing:  Intermittent Progression:  Resolved Chronicity:  New Context comment:  After eating Relieved by:  Nothing Worsened by:  Nothing tried Ineffective treatments:  None tried Associated symptoms: no chest pain, no cough, no diarrhea, no dysuria, no fever, no hematuria, no nausea, no shortness of breath and no vomiting     Past Medical History  Diagnosis Date  . GERD (gastroesophageal reflux disease)    History reviewed. No pertinent past surgical history. Family History  Problem Relation Age of Onset  . Cholelithiasis Mother   . Cholecystitis Maternal Aunt   . Diabetes Maternal Grandfather     borderline  . Lung cancer Maternal Uncle     great  . Prostate cancer Paternal Uncle   . Lung cancer Paternal Grandfather   . Kidney failure Paternal Grandmother   . Hypertension Paternal Grandmother   . Hypertension Maternal Grandmother   . Asthma Maternal Grandmother   . Asthma Mother   . Peptic Ulcer Maternal Grandfather   . Heart defect Father     MVP  . Scoliosis Mother   . GER disease Mother    History  Substance Use Topics  . Smoking status: Never Smoker   . Smokeless tobacco: Never Used  . Alcohol Use: No    Review of Systems  Constitutional: Negative for fever.  HENT: Negative for drooling and rhinorrhea.   Eyes: Negative for pain.  Respiratory: Negative for cough and shortness of breath.    Cardiovascular: Negative for chest pain and leg swelling.  Gastrointestinal: Positive for abdominal pain. Negative for nausea, vomiting and diarrhea.  Genitourinary: Negative for dysuria and hematuria.  Musculoskeletal: Negative for gait problem and neck pain.  Skin: Negative for color change.  Neurological: Negative for numbness and headaches.  Hematological: Negative for adenopathy.  Psychiatric/Behavioral: Negative for behavioral problems.  All other systems reviewed and are negative.     Allergies  Penicillins  Home Medications   Prior to Admission medications   Medication Sig Start Date End Date Taking? Authorizing Provider  esomeprazole (NEXIUM) 40 MG capsule Take 1 capsule (40 mg total) by mouth daily. 10/10/12   Aleksei Plotnikov V, MD  sucralfate (CARAFATE) 1 GM/10ML suspension Take 10 mLs (1 g total) by mouth 4 (four) times daily -  with meals and at bedtime. 07/18/13   Princella PellegriniJessica D. Zehr, PA-C   BP 133/76 mmHg  Pulse 72  Temp(Src) 98.4 F (36.9 C) (Oral)  Resp 15  Ht 5\' 8"  (1.727 m)  Wt 150 lb (68.04 kg)  BMI 22.81 kg/m2  SpO2 97% Physical Exam  Constitutional: He is oriented to person, place, and time. He appears well-developed and well-nourished.  HENT:  Head: Normocephalic and atraumatic.  Right Ear: External ear normal.  Left Ear: External ear normal.  Nose: Nose normal.  Mouth/Throat: Oropharynx is clear and moist. No oropharyngeal exudate.  Eyes: Conjunctivae and EOM are normal. Pupils are equal, round, and reactive to light.  Neck: Normal range of motion. Neck supple.  Cardiovascular: Normal rate, regular rhythm, normal heart sounds and intact distal pulses.  Exam reveals no gallop and no friction rub.   No murmur heard. Pulmonary/Chest: Effort normal and breath sounds normal. No respiratory distress. He has no wheezes.  Abdominal: Soft. Bowel sounds are normal. He exhibits no distension. There is no tenderness. There is no rebound and no guarding.   Musculoskeletal: Normal range of motion. He exhibits no edema or tenderness.  Neurological: He is alert and oriented to person, place, and time.  Skin: Skin is warm and dry.  Psychiatric: He has a normal mood and affect. His behavior is normal.  Nursing note and vitals reviewed.   ED Course  Procedures (including critical care time) Labs Review Labs Reviewed  COMPREHENSIVE METABOLIC PANEL - Abnormal; Notable for the following:    Glucose, Bld 105 (*)    GFR calc non Af Amer 89 (*)    Anion gap 4 (*)    All other components within normal limits  CBC WITH DIFFERENTIAL/PLATELET  LIPASE, BLOOD    Imaging Review US Abdomen Limited Ruq  08/16/2014   CLINICAL DATA:  24 year old male with right upper quadrant pain  EXAM: US ABDOMEN LIMITED - RIGHT UPPER QUADRANT  COMPARISON:  None.  FINDINGS: Gallbladder:  No gallstones or wall thickening visualized. No sonographic Murphy sign noted.  Common bile duct:  Diameter: Within normal limits at 3 mm  Liver:  No focal lesion identified. Within normal limits in parenchymal echogenicity.  IMPRESSION: Negative right upper quadrant ultrasound.   Electronically Signed   By: Malachy Moan M.D.   On: 08/16/2014 20:31     EKG Interpretation None      MDM   Final diagnoses:  RUQ pain    5:12 PM 24 y.o. male w hx of GERD who presents with epigastric and right upper quadrant pain which occurred around noon about 15 minutes after eating fries and a strawberry lemonade. He notes that his pain was sharp and lasted about 30 minutes. He had a recurrent episode about an hour after that has been asymptomatic since that time. He denies any fevers or vomiting. Vital signs unremarkable here. Asymptomatic currently. Laboratory prior to my evaluation which is unremarkable. I offered and [place] and he would like this.   8:58 PM: I interpreted/reviewed the labs and/or imaging which were non-contributory. Will rec he restart his nexium. He remains asx on exam.  I have  discussed the diagnosis/risks/treatment options with the patient and believe the pt to be eligible for discharge home to follow-up with his pcp. We also discussed returning to the ED immediately if new or worsening sx occur. We discussed the sx which are most concerning (e.g., worsening pain, fever) that necessitate immediate return. Medications administered to the patient during their visit and any new prescriptions provided to the patient are listed below.  Medications given during this visit Medications  gi cocktail (Maalox,Lidocaine,Donnatal) (30 mLs Oral Given 08/16/14 1718)    Discharge Medication List as of 08/16/2014  8:59 PM    START taking these medications   Details  !! esomeprazole (NEXIUM) 40 MG capsule Take 1 capsule (40 mg total) by mouth daily., Starting 08/16/2014, Until Discontinued, Print     !! - Potential duplicate medications found. Please discuss with provider.       Purvis Sheffield, MD 08/17/14 [phone]

## 2014-08-16 NOTE — Discharge Instructions (Signed)

## 2014-08-17 ENCOUNTER — Telehealth: Payer: Self-pay | Admitting: Internal Medicine

## 2014-08-17 ENCOUNTER — Ambulatory Visit (INDEPENDENT_AMBULATORY_CARE_PROVIDER_SITE_OTHER): Payer: Federal, State, Local not specified - PPO | Admitting: Physician Assistant

## 2014-08-17 ENCOUNTER — Encounter: Payer: Self-pay | Admitting: Physician Assistant

## 2014-08-17 VITALS — BP 104/46 | HR 72 | Ht 67.5 in | Wt 154.5 lb

## 2014-08-17 DIAGNOSIS — R1013 Epigastric pain: Secondary | ICD-10-CM

## 2014-08-17 DIAGNOSIS — K219 Gastro-esophageal reflux disease without esophagitis: Secondary | ICD-10-CM

## 2014-08-17 MED ORDER — PANTOPRAZOLE SODIUM 40 MG PO TBEC: 40.0000 mg | DELAYED_RELEASE_TABLET | Freq: Every day | ORAL | Status: DC

## 2014-08-17 MED ORDER — RANITIDINE HCL 300 MG PO TABS: 300.0000 mg | ORAL_TABLET | Freq: Every day | ORAL | Status: DC

## 2014-08-17 MED ORDER — SUCRALFATE 1 GM/10ML PO SUSP: ORAL | Status: DC

## 2014-08-17 NOTE — Progress Notes (Signed)
Patient ID: Alex Hernandez, male   DOB: 1990-09-20, 24 y.o.   MRN: [id number]     History of Present Illness:   Alex Hernandez is a 24 year old male who was known to Dr. With a history of GERD he had an upper endoscopy on 08/03/2013 which was normal. He was advised to continue Nexium and antireflux regimen however after several months he felt better and discontinued their use. Over the last few months he has been having increased heartburn. He has been getting heartburn on a daily basis with intermittent regurgitation. No dysphagia. He has had increased belching and burping and feels that he gets pressure in his chest. Yesterday he was seen in the emergency room. He had drank a large cup of strawberry lemonade and had a large order a [place] and shortly thereafter developed burning epigastric pain and pain in his chest with no shortness of breath or diaphoresis. He was seen in the emergency room and given a GI cocktail which provided relief of his symptoms he had an abdominal ultrasound which was normal and laboratory studies which were normal. He was advised to restart his Nexium and follow-up with GI. He reports that he drinks a copious amount of Coca-Cola and Dr. Reino Kent on a daily basis. He does not eat a Hernandez of spicy foods but he does have fried food several days per week. 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 Yesterday he noticed a red spot on his abdomen and this along with his epigastric pain caused him to go to the ER. The red spot cleared last night and has not recurred. He has had no change in his bowel habits or stool caliber. His appetite has been good and his weight has been stable. He has had no bright red blood per rectum or melena. He denies excess use of NSAIDs.   Past Medical History  Diagnosis Date  . GERD (gastroesophageal reflux disease)     History reviewed. No pertinent past surgical history. Family History  Problem Relation Age of Onset  .  Cholelithiasis Mother   . Cholecystitis Maternal Aunt   . Diabetes Maternal Grandfather     borderline  . Lung cancer Maternal Uncle     great  . Prostate cancer Paternal Uncle   . Lung cancer Paternal Grandfather   . Kidney failure Paternal Grandmother   . Hypertension Paternal Grandmother   . Hypertension Maternal Grandmother   . Asthma Maternal Grandmother   . Asthma Mother   . Peptic Ulcer Maternal Grandfather   . Heart defect Father     MVP  . Scoliosis Mother   . GER disease Mother    History  Substance Use Topics  . Smoking status: Never Smoker   . Smokeless tobacco: Never Used  . Alcohol Use: No   Current Outpatient Prescriptions  Medication Sig Dispense Refill  . esomeprazole (NEXIUM) 40 MG capsule Take 1 capsule (40 mg total) by mouth daily. 30 capsule 11  . esomeprazole (NEXIUM) 40 MG capsule Take 1 capsule (40 mg total) by mouth daily. 30 capsule 0  . pantoprazole (PROTONIX) 40 MG tablet Take 1 tablet (40 mg total) by mouth daily. 30 tablet 4  . ranitidine (ZANTAC) 300 MG tablet Take 1 tablet (300 mg total) by mouth at bedtime. 30 tablet 2  . sucralfate (CARAFATE) 1 GM/10ML suspension Take 10 ml before each meal and at bedtime. 420 mL 2   No current facility-administered medications for  this visit.   Allergies  Allergen Reactions  . Penicillins     REACTION: Hives      Review of Systems: Gen: Denies any fever, chills, sweats, anorexia, fatigue, weakness, malaise, weight loss, and sleep disorder CV: Denies chest pain, angina, palpitations, syncope, orthopnea, PND, peripheral edema, and claudication. Resp: Denies dyspnea at rest, dyspnea with exercise, cough, sputum, wheezing, coughing up blood, and pleurisy. GI: Denies vomiting blood, jaundice, and fecal incontinence.   Denies dysphagia or odynophagia. GU : Denies urinary burning, blood in urine, urinary frequency, urinary hesitancy, nocturnal urination, and urinary incontinence. MS: Denies joint pain,  limitation of movement, and swelling, stiffness, low back pain, extremity pain. Denies muscle weakness, cramps, atrophy.  Derm: Denies rash, itching, dry skin, hives, moles, warts, or unhealing ulcers.  Psych: Denies depression, anxiety, memory loss, suicidal ideation, hallucinations, paranoia, and confusion. Heme: Denies bruising, bleeding, and enlarged lymph nodes. Neuro:  Denies any headaches, dizziness, paresthesia Endo:  Denies any problems with DM, thyroid, adrenal  LAB RESULTS:  Recent Labs  08/16/14 1547  WBC 7.0  HGB 14.3  HCT 41.6  PLT 174   BMET  Recent Labs  08/16/14 1547  NA 138  K 4.1  CL 102  CO2 32  GLUCOSE 105*  BUN 8  CREATININE 1.14  CALCIUM 9.3   LFT  Recent Labs  08/16/14 1547  PROT 7.0  ALBUMIN 4.1  AST 25  ALT 22  ALKPHOS 49  BILITOT 0.7      Studies:   US Abdomen Limited Ruq  08/16/2014   CLINICAL DATA:  24 year old male with right upper quadrant pain  EXAM: US ABDOMEN LIMITED - RIGHT UPPER QUADRANT  COMPARISON:  None.  FINDINGS: Gallbladder:  No gallstones or wall thickening visualized. No sonographic Murphy sign noted.  Common bile duct:  Diameter: Within normal limits at 3 mm  Liver:  No focal lesion identified. Within normal limits in parenchymal echogenicity.  IMPRESSION: Negative right upper quadrant ultrasound.   Electronically Signed   By: Malachy Moan M.D.   On: 08/16/2014 20:31     Physical Exam: General: Pleasant, well developed male in no acute distress Head: Normocephalic and atraumatic Eyes:  sclerae anicteric, conjunctiva pink  Ears: Normal auditory acuity Lungs: Clear throughout to auscultation Heart: Regular rate and rhythm Abdomen: Soft, non distended, non-tender. No masses, no hepatomegaly. Normal bowel sounds Musculoskeletal: Symmetrical with no gross deformities  Extremities: No edema  Neurological: Alert oriented x 4, grossly nonfocal Psychological:  Alert and cooperative. Normal mood and  affect  Assessment and Recommendations:  24 year old male with a history of GERD now with several months of recurrent frequent heartburn and belching, status post a visit to the emergency room yesterday here for follow-up. His symptoms are likely related to poorly controlled reflux. An antireflux regimen has been reviewed at length with the patient and his mother. He states his insurance no longer covers Nexium, and so he will be given a trial of pantoprazole 40 mg by mouth every morning 30 minutes prior to breakfast. He will also try ranitidine 300 mg at at bedtime, along with Carafate suspension 10 ML before meals and at bedtime. He will return in 4 weeks for reevaluation, sooner if needed.       Tuwanna Krausz, Moise Boring 08/17/2014,

## 2014-08-17 NOTE — Telephone Encounter (Signed)
Pt states he has been having abdominal pain and went to the ER to be seen. States there is a place that is tender to touch and states that it was visibly red. Pt scheduled to see Lori Hvozdovic, PA-C today at 1:45pm. Pt aware of appt.

## 2014-08-17 NOTE — Patient Instructions (Signed)
We have sent the following medications to your pharmacy for you to pick up at your convenience:  Protonix, Ranitidine, Carafate  Discontinue your Nexium  Please follow up with Lawson FiscalLori on 09/17/2014 at 2:15pm

## 2014-08-20 NOTE — Progress Notes (Signed)
Agree with initial assessment and plans 

## 2014-09-17 ENCOUNTER — Ambulatory Visit (INDEPENDENT_AMBULATORY_CARE_PROVIDER_SITE_OTHER): Payer: Federal, State, Local not specified - PPO | Admitting: Physician Assistant

## 2014-09-17 ENCOUNTER — Encounter: Payer: Self-pay | Admitting: Physician Assistant

## 2014-09-17 VITALS — BP 104/62 | HR 68 | Ht 67.5 in | Wt 150.1 lb

## 2014-09-17 DIAGNOSIS — K649 Unspecified hemorrhoids: Secondary | ICD-10-CM

## 2014-09-17 DIAGNOSIS — K219 Gastro-esophageal reflux disease without esophagitis: Secondary | ICD-10-CM

## 2014-09-17 MED ORDER — ESOMEPRAZOLE MAGNESIUM 40 MG PO CPDR: 40.0000 mg | DELAYED_RELEASE_CAPSULE | Freq: Every day | ORAL | Status: DC

## 2014-09-17 MED ORDER — HYDROCORTISONE ACETATE 25 MG RE SUPP: 25.0000 mg | Freq: Two times a day (BID) | RECTAL | Status: DC

## 2014-09-17 NOTE — Progress Notes (Signed)
Patient ID: Alex Hernandez, male   DOB: February 15, 1991, 24 y.o.   MRN: [id number]     History of Present Illness:  Alex Hernandez is a 23 year old male known to Dr. Marina Goodell with a history of GERD. He had had an upper endoscopy in January 2015 which was normal. He was seen here in January 2016 with heartburn and intermittent regurgitation and no dysphagia. He was restarted on his Nexium and returns today for follow-up he states he is feeling well with no breakthrough heartburn he hasn't no nausea, vomiting, or epigastric pain. His only complaint today is that he had a bowel movement this morning and had a small amount of blood on the toilet tissue he does have some rectal itching but no pain. His stools are not hard and he is not straining. Occasionally have the sensation of incomplete evacuation and has to wipe copiously after bowel movements.   Past Medical History  Diagnosis Date  . GERD (gastroesophageal reflux disease)     History reviewed. No pertinent past surgical history. Family History  Problem Relation Age of Onset  . Cholelithiasis Mother   . Cholecystitis Maternal Aunt   . Diabetes Maternal Grandfather     borderline  . Lung cancer Maternal Uncle     great  . Prostate cancer Paternal Uncle   . Lung cancer Paternal Grandfather   . Kidney failure Paternal Grandmother   . Hypertension Paternal Grandmother   . Hypertension Maternal Grandmother   . Asthma Maternal Grandmother   . Asthma Mother   . Peptic Ulcer Maternal Grandfather   . Heart defect Father     MVP  . Scoliosis Mother   . GER disease Mother    History  Substance Use Topics  . Smoking status: Never Smoker   . Smokeless tobacco: Never Used  . Alcohol Use: No   Current Outpatient Prescriptions  Medication Sig Dispense Refill  . esomeprazole (NEXIUM) 40 MG capsule Take 1 capsule (40 mg total) by mouth daily. 30 capsule 11  . pantoprazole (PROTONIX) 40 MG tablet Take 1 tablet (40 mg total) by mouth daily. 30 tablet 4  .  ranitidine (ZANTAC) 300 MG tablet Take 1 tablet (300 mg total) by mouth at bedtime. 30 tablet 2  . sucralfate (CARAFATE) 1 GM/10ML suspension Take 10 ml before each meal and at bedtime. 420 mL 2  . esomeprazole (NEXIUM) 40 MG capsule Take 1 capsule (40 mg total) by mouth daily at 12 noon. 30 capsule 6  . hydrocortisone (ANUSOL-HC) 25 MG suppository Place 1 suppository (25 mg total) rectally 2 (two) times daily. 20 suppository 1   No current facility-administered medications for this visit.   Allergies  Allergen Reactions  . Penicillins     REACTION: Hives      Review of Systems: Gen: Denies any fever, chills, sweats, anorexia, fatigue, weakness, malaise, weight loss, and sleep disorder CV: Denies chest pain, angina, palpitations, syncope, orthopnea, PND, peripheral edema, and claudication. Resp: Denies dyspnea at rest, dyspnea with exercise, cough, sputum, wheezing, coughing up blood, and pleurisy. GI: Denies vomiting blood, jaundice, and fecal incontinence.   Denies dysphagia or odynophagia. GU : Denies urinary burning, blood in urine, urinary frequency, urinary hesitancy, nocturnal urination, and urinary incontinence. MS: Denies joint pain, limitation of movement, and swelling, stiffness, low back pain, extremity pain. Denies muscle weakness, cramps, atrophy.  Derm: Denies rash, itching, dry skin, hives, moles, warts, or unhealing ulcers.  Psych: Denies depression, anxiety, memory loss, suicidal ideation, hallucinations, paranoia, and confusion. Heme:  Denies bruising, bleeding, and enlarged lymph nodes. Neuro:  Denies any headaches, dizziness, paresthesia Endo:  Denies any problems with DM, thyroid, adrenal   Physical Exam: General: Pleasant, well developed male in no acute distress Head: Normocephalic and atraumatic Eyes:  sclerae anicteric, conjunctiva pink  Ears: Normal auditory acuity Lungs: Clear throughout to auscultation Heart: Regular rate and rhythm Abdomen: Soft, non  distended, non-tender. No masses, no hepatomegaly. Normal bowel sounds Rectal:small internal hemorrhoid, brown stool guiac neg Musculoskeletal: Symmetrical with no gross deformities  Extremities: No edema  Neurological: Alert oriented x 4, grossly nonfocal Psychological:  Alert and cooperative. Normal mood and affect  Assessment and Recommendations:  #1. GERD. He's been advised to continue an antireflux regimen and continue S omeprazole 40 mg daily.  #2. Internal hemorrhoids. He's been advised to adhere to a high-fiber diet and add or water to his diet he will be given a trial of Anusol HC suppositories 1 per rectum twice daily for 10 days. He will use Tucks wipes.  He will follow up as needed.       Karalyne Nusser, Moise Boring 09/17/2014,

## 2014-09-17 NOTE — Patient Instructions (Signed)
We have sent in your prescriptions to your pharmacy Call back as needed

## 2014-09-18 NOTE — Progress Notes (Signed)
Agree with assessment and plans 

## 2015-06-10 ENCOUNTER — Ambulatory Visit (INDEPENDENT_AMBULATORY_CARE_PROVIDER_SITE_OTHER): Payer: Federal, State, Local not specified - PPO | Admitting: Internal Medicine

## 2015-06-10 ENCOUNTER — Encounter: Payer: Self-pay | Admitting: Internal Medicine

## 2015-06-10 VITALS — BP 110/74 | HR 74 | Ht 68.0 in | Wt 157.6 lb

## 2015-06-10 DIAGNOSIS — R0600 Dyspnea, unspecified: Secondary | ICD-10-CM | POA: Diagnosis not present

## 2015-06-10 NOTE — Progress Notes (Signed)
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   06/10/2015 1st [facility] office visit/ Alex Hernandez   Chief Complaint  Patient presents with  . Pulmonary Consult    Self referral. Pt c/o SOB since Summer of 2016. He states that he gets SOB when air blows in his face, such as a hair dryer. He also has occ "fluttering in the sternum area".    previously eval for atypical cp started Nov 2014 rx with ppi  with neg EGD 08/03/2013 eventually stopped the ppi April 2015 then January 2016 same pain assoc with dysphagia better on gerd rx  then cp resolved and d/c x one month rx s recurrence since.   Both occasions when started the meds the pain went away and have not recurred since Jan 2016 off the meds since Feb 2016.  Onset of symptoms in [place] while riding on top part of double decker bus in August reproduced when blowing hot air in face with  a hair dryer.  Has palpitations not related to sob  Has not worked out since prior to the onset of symptoms and was last able to do so fine June 2016   No obvious other patterns in day to day or daytime variabilty or assoc chronic cough or cp or chest tightness, subjective wheeze overt sinus or hb symptoms. No unusual exp hx or h/o childhood pna/ asthma or knowledge of premature birth.  Sleeping ok without nocturnal  or early am exacerbation  of respiratory  c/o's or need for noct saba. Also denies any obvious fluctuation of symptoms with weather or environmental changes or other aggravating or alleviating factors except as outlined above   Current Medications, Allergies, Complete Past Medical History, Past Surgical History, Family History, and Social History were reviewed in [employer] record.            Review of Systems  Constitutional:  Negative for fever, chills, activity change, appetite change and unexpected weight change.  HENT: Negative for congestion, dental problem, postnasal drip, rhinorrhea, sneezing, sore throat, trouble swallowing and voice change.   Eyes: Negative for visual disturbance.  Respiratory: Negative for cough, choking and shortness of breath.   Cardiovascular: Negative for chest pain and leg swelling.  Gastrointestinal: Negative for nausea, vomiting and abdominal pain.  Genitourinary: Negative for difficulty urinating.  Musculoskeletal: Negative for arthralgias.  Skin: Negative for rash.  Psychiatric/Behavioral: Negative for behavioral problems and confusion.       Objective:   Physical Exam   Pleasant amb bm nad  Wt Readings from Last 3 Encounters:  06/10/15 157 lb 9.6 oz (71.487 kg)  09/17/14 150 lb 2 oz (68.096 kg)  08/17/14 154 lb 8 oz (70.081 kg)    Vital signs reviewed   HEENT: nl dentition, turbinates, and oropharynx. Nl external ear canals without cough reflex   NECK :  without JVD/Nodes/TM/ nl carotid upstrokes bilaterally   LUNGS: no acc muscle use, clear to A and P bilaterally without cough on insp or exp maneuvers   CV:  RRR  no s3 or murmur or increase in P2, no edema   ABD:  soft and nontender with nl excursion in the supine position. No bruits or organomegaly, bowel sounds nl  MS:  warm without  deformities, calf tenderness, cyanosis or clubbing  SKIN: warm and dry without lesions    NEURO:  alert, approp, no deficits            Assessment & Plan:

## 2015-06-10 NOTE — Patient Instructions (Addendum)
To get the most out of exercise, you need to be continuously aware that you are short of breath, but never out of breath, for 30 minutes daily. As you improve, it will actually be easier for you to do the same amount of exercise  in  30 minutes so always push to the level where you are short of breath.     If not experiencing improvement with regular exercise please give me a call to complete the work up

## 2015-06-10 NOTE — Assessment & Plan Note (Addendum)
06/10/2015  Walked RA x 3 laps @ 185 ft each stopped due to  End of study, brisk  pace, no sob or desat    Sob only reproduced with hot air blowing in his face, once in [place] and once in [place] (hair dryer) and not reproduced with exertion so Symptoms are markedly disproportionate to objective findings and not clear this is a lung problem but pt does appear to have difficult airway management issues.  DDX of  difficult airways management all start with A and  include Adherence, Ace Inhibitors, Acid Reflux, Active Sinus Disease, Alpha 1 Antitripsin deficiency, Anxiety masquerading as Airways dz,  ABPA,  allergy(esp in young), Aspiration (esp in elderly), Adverse effects of meds,  Active smokers, A bunch of PE's (a small clot burden can't cause this syndrome unless there is already severe underlying pulm or vascular dz with poor reserve) plus two Bs  = Bronchiectasis and Beta blocker use..and one C= CHF  ? 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   ? Anxiety > usually dx of exclusion but near the top of the list here  Offered to work thu the ddx if not improving with reg exercise, which I strongly encouraged   Total time devoted to counseling  = 25/729m review case with pt/ discussion of options/alternatives/ giving and going over instructions (see avs)

## 2015-07-02 ENCOUNTER — Ambulatory Visit (INDEPENDENT_AMBULATORY_CARE_PROVIDER_SITE_OTHER): Payer: Federal, State, Local not specified - PPO | Admitting: Family

## 2015-07-02 ENCOUNTER — Encounter: Payer: Self-pay | Admitting: Family

## 2015-07-02 VITALS — BP 132/80 | HR 65 | Temp 98.5°F | Resp 18 | Ht 67.5 in | Wt 164.0 lb

## 2015-07-02 DIAGNOSIS — J019 Acute sinusitis, unspecified: Secondary | ICD-10-CM | POA: Diagnosis not present

## 2015-07-02 MED ORDER — LEVOFLOXACIN 500 MG PO TABS: 500.0000 mg | ORAL_TABLET | Freq: Every day | ORAL | Status: DC

## 2015-07-02 NOTE — Assessment & Plan Note (Addendum)
Symptoms and exam consistent with acute bacterial sinusitis and underlying allergic rhinitis. Start levofloxacin. Continue over-the-counter medications as needed for symptom relief and supportive care. Follow-up if symptoms worsen or fail to improve.

## 2015-07-02 NOTE — Patient Instructions (Signed)
Thank you for choosing [facility].  Summary/Instructions:  Your prescription(s) have been submitted to your pharmacy or been printed and provided for you. Please take as directed and contact our office if you believe you are having problem(s) with the medication(s) or have any questions.  If your symptoms worsen or fail to improve, please contact our office for further instruction, or in case of emergency go directly to the emergency room at the closest medical facility.   General Recommendations:    Please drink plenty of fluids.  Get plenty of rest   Sleep in humidified air  Use saline nasal sprays  Netti pot   OTC Medications:  Decongestants - helps relieve congestion   Flonase (generic fluticasone) or Nasacort (generic triamcinolone) - please make sure to use the "cross-over" technique at a 45 degree angle towards the opposite eye as opposed to straight up the nasal passageway.   Sudafed (generic pseudoephedrine - Note this is the one that is available behind the pharmacy counter); Products with phenylephrine (-PE) may also be used but is often not as effective as pseudoephedrine.   If you have HIGH BLOOD PRESSURE - Coricidin HBP; AVOID any product that is -D as this contains pseudoephedrine which may increase your blood pressure.  Afrin (oxymetazoline) every 6-8 hours for up to 3 days.   Allergies - helps relieve runny nose, itchy eyes and sneezing   Claritin (generic loratidine), Allegra (fexofenidine), or Zyrtec (generic cyrterizine) for runny nose. These medications should not cause drowsiness.  Note - Benadryl (generic diphenhydramine) may be used however may cause drowsiness  Cough -   Delsym or Robitussin (generic dextromethorphan)  Expectorants - helps loosen mucus to ease removal   Mucinex (generic guaifenesin) as directed on the package.  Headaches / General Aches   Tylenol (generic acetaminophen) - DO NOT EXCEED 3 grams (3,000 mg) in a 24  hour time period  Advil/Motrin (generic ibuprofen)   Sore Throat -   Salt water gargle   Chloraseptic (generic benzocaine) spray or lozenges / Sucrets (generic dyclonine)    Sinusitis Sinusitis is redness, soreness, and inflammation of the paranasal sinuses. Paranasal sinuses are air pockets within the bones of your face (beneath the eyes, the middle of the forehead, or above the eyes). In healthy paranasal sinuses, mucus is able to drain out, and air is able to circulate through them by way of your nose. However, when your paranasal sinuses are inflamed, mucus and air can become trapped. This can allow bacteria and other germs to grow and cause infection. Sinusitis can develop quickly and last only a short time (acute) or continue over a long period (chronic). Sinusitis that lasts for more than 12 weeks is considered chronic.  CAUSES  Causes of sinusitis include:  Allergies.  Structural abnormalities, such as displacement of the cartilage that separates your nostrils (deviated septum), which can decrease the air flow through your nose and sinuses and affect sinus drainage.  Functional abnormalities, such as when the small hairs (cilia) that line your sinuses and help remove mucus do not work properly or are not present. SIGNS AND SYMPTOMS  Symptoms of acute and chronic sinusitis are the same. The primary symptoms are pain and pressure around the affected sinuses. Other symptoms include:  Upper toothache.  Earache.  Headache.  Bad breath.  Decreased sense of smell and taste.  A cough, which worsens when you are lying flat.  Fatigue.  Fever.  Thick drainage from your nose, which often is green and may   contain pus (purulent).  Swelling and warmth over the affected sinuses. DIAGNOSIS  Your health care provider will perform a physical exam. During the exam, your health care provider may:  Look in your nose for signs of abnormal growths in your nostrils (nasal  polyps).  Tap over the affected sinus to check for signs of infection.  View the inside of your sinuses (endoscopy) using an imaging device that has a light attached (endoscope). If your health care provider suspects that you have chronic sinusitis, one or more of the following tests may be recommended:  Allergy tests.  Nasal culture. A sample of mucus is taken from your nose, sent to a lab, and screened for bacteria.  Nasal cytology. A sample of mucus is taken from your nose and examined by your health care provider to determine if your sinusitis is related to an allergy. TREATMENT  Most cases of acute sinusitis are related to a viral infection and will resolve on their own within 10 days. Sometimes medicines are prescribed to help relieve symptoms (pain medicine, decongestants, nasal steroid sprays, or saline sprays).  However, for sinusitis related to a bacterial infection, your health care provider will prescribe antibiotic medicines. These are medicines that will help kill the bacteria causing the infection.  Rarely, sinusitis is caused by a fungal infection. In theses cases, your health care provider will prescribe antifungal medicine. For some cases of chronic sinusitis, surgery is needed. Generally, these are cases in which sinusitis recurs more than 3 times per year, despite other treatments. HOME CARE INSTRUCTIONS   Drink plenty of water. Water helps thin the mucus so your sinuses can drain more easily.  Use a humidifier.  Inhale steam 3 to 4 times a day (for example, sit in the bathroom with the shower running).  Apply a warm, moist washcloth to your face 3 to 4 times a day, or as directed by your health care provider.  Use saline nasal sprays to help moisten and clean your sinuses.  Take medicines only as directed by your health care provider.  If you were prescribed either an antibiotic or antifungal medicine, finish it all even if you start to feel better. SEEK IMMEDIATE  MEDICAL CARE IF:  You have increasing pain or severe headaches.  You have nausea, vomiting, or drowsiness.  You have swelling around your face.  You have vision problems.  You have a stiff neck.  You have difficulty breathing. MAKE SURE YOU:   Understand these instructions.  Will watch your condition.  Will get help right away if you are not doing well or get worse. Document Released: 07/06/2005 Document Revised: 11/20/2013 Document Reviewed: 07/21/2011 ExitCare Patient Information 2015 [employer]. This information is not intended to replace advice given to you by your health care provider. Make sure you discuss any questions you have with your health care provider.   

## 2015-07-02 NOTE — Progress Notes (Signed)
   Subjective:    Patient ID: Alex Hernandez, male    DOB: 11/23/1990, 24 y.o.   MRN: [id number]  Chief Complaint  Patient presents with  . Nasal Congestion    started last tuesday, drainage, congestion, sinus pressure, sneezing, fatigue    HPI:  Alex Hernandez is a 24 y.o. male who  has a past medical history of GERD (gastroesophageal reflux disease). and presents today for an acute office visit.  This is a new problem. Associated symptoms of congestion, drainage, sinus pressure, sneezing, and fatigue started approximately one week ago. Also notes sneezing and eye watering. Denies fevers. Modifying factors include Tylenol cold/flu which has not helped very much. Course of the symptoms has gotten worse with the possibility of slight improvement and then worsening. Denies any recent antibiotic use.   Allergies  Allergen Reactions  . Penicillins     REACTION: Hives     No current outpatient prescriptions on file prior to visit.   No current facility-administered medications on file prior to visit.    Review of Systems  Constitutional: Negative for fever and chills.  HENT: Positive for congestion and sinus pressure. Negative for ear pain and sore throat.   Respiratory: Positive for cough and shortness of breath. Negative for chest tightness.   Neurological: Positive for headaches.      Objective:    BP 132/80 mmHg  Pulse 65  Temp(Src) 98.5 F (36.9 C) (Oral)  Resp 18  Ht 5' 7.5" (1.715 m)  Wt 164 lb (74.39 kg)  BMI 25.29 kg/m2  SpO2 99% Nursing note and vital signs reviewed.  Physical Exam  Constitutional: He is oriented to person, place, and time. He appears well-developed and well-nourished. No distress.  HENT:  Right Ear: Hearing, tympanic membrane, external ear and ear canal normal.  Left Ear: Hearing, tympanic membrane, external ear and ear canal normal.  Nose: Right sinus exhibits no maxillary sinus tenderness and no frontal sinus tenderness. Left sinus  exhibits no maxillary sinus tenderness and no frontal sinus tenderness.  Mouth/Throat: Uvula is midline, oropharynx is clear and moist and mucous membranes are normal.  Neck: Neck supple.  Cardiovascular: Normal rate, regular rhythm, normal heart sounds and intact distal pulses.   Pulmonary/Chest: Effort normal and breath sounds normal.  Neurological: He is alert and oriented to person, place, and time.  Skin: Skin is warm and dry.  Psychiatric: He has a normal mood and affect. His behavior is normal. Judgment and thought content normal.       Assessment & Plan:   Problem List Items Addressed This Visit      Respiratory   Sinusitis, acute - Primary    Symptoms and exam consistent with acute bacterial sinusitis and underlying allergic rhinitis. Start levofloxacin. Continue over-the-counter medications as needed for symptom relief and supportive care. Follow-up if symptoms worsen or fail to improve.      Relevant Medications   levofloxacin (LEVAQUIN) 500 MG tablet

## 2015-07-02 NOTE — Progress Notes (Signed)
Pre visit review using our clinic review tool, if applicable. No additional management support is needed unless otherwise documented below in the visit note. 

## 2015-09-04 ENCOUNTER — Emergency Department (HOSPITAL_COMMUNITY)
Admission: EM | Admit: 2015-09-04 | Discharge: 2015-09-04 | Disposition: A | Discharge status: Home / Self Care | Payer: Federal, State, Local not specified - PPO | Source: Home / Self Care | Attending: Family Medicine | Admitting: Family Medicine

## 2015-09-04 ENCOUNTER — Encounter (HOSPITAL_COMMUNITY): Payer: Self-pay | Admitting: Emergency Medicine

## 2015-09-04 DIAGNOSIS — K2970 Gastritis, unspecified, without bleeding: Secondary | ICD-10-CM | POA: Diagnosis not present

## 2015-09-04 MED ORDER — ONDANSETRON HCL 4 MG PO TABS: 4.0000 mg | ORAL_TABLET | Freq: Four times a day (QID) | ORAL | Status: DC

## 2015-09-04 NOTE — ED Notes (Signed)
Pt started with nausea and diarrhea last night about 2130.  He woke u at 0115 with vomiting.  Pt has had a fever today, 100.4, then 100.8.  He has taken Nauzene and Advil with little relief.  Pt has been drinking ginger ale, water and jello.  He states he keeps it down for about two hours then vomits.

## 2015-09-04 NOTE — ED Provider Notes (Signed)
CSN: [id number]     Arrival date & time 09/04/15  1916 History   First MD Initiated Contact with Patient 09/04/15 2030     Chief Complaint  Patient presents with  . Nausea  . Emesis  . Diarrhea  . Fever   (Consider location/radiation/quality/duration/timing/severity/associated sxs/prior Treatment) HPI Patient is here because of vomiting and diarrhea for the last 24 hours. He states he has not had any fever. States that any fluids that he takes he vomits approximately one hour later. Also had several watery brown diarrhea stools. He states that there've been a couple people at work with similar symptoms. Mother is here because she is concerned that he may have neurovirus. Past Medical History  Diagnosis Date  . GERD (gastroesophageal reflux disease)    History reviewed. No pertinent past surgical history. Family History  Problem Relation Age of Onset  . Cholelithiasis Mother   . Cholecystitis Maternal Aunt   . Diabetes Maternal Grandfather     borderline  . Lung cancer Maternal Uncle     great  . Prostate cancer Paternal Uncle   . Lung cancer Paternal Grandfather   . Kidney failure Paternal Grandmother   . Hypertension Paternal Grandmother   . Hypertension Maternal Grandmother   . Asthma Maternal Grandmother   . Asthma Mother   . Peptic Ulcer Maternal Grandfather   . Heart defect Father     MVP  . Scoliosis Mother   . GER disease Mother   . Asthma Brother    Social History  Substance Use Topics  . Smoking status: Never Smoker   . Smokeless tobacco: Never Used  . Alcohol Use: No    Review of Systems  Vomiting and some diarrhea no fever Allergies  Penicillins  Home Medications   Prior to Admission medications   Medication Sig Start Date End Date Taking? Authorizing Provider  Dextrose-Fructose-Sod Citrate (NAUZENE PO) Take by mouth.   Yes Historical Provider, MD  Ibuprofen (ADVIL) 200 MG CAPS Take by mouth.   Yes Historical Provider, MD  levofloxacin (LEVAQUIN)  500 MG tablet Take 1 tablet (500 mg total) by mouth daily. 07/02/15   Veryl Speak, FNP  ondansetron (ZOFRAN) 4 MG tablet Take 1 tablet (4 mg total) by mouth every 6 (six) hours. 09/04/15   Tharon Aquas, PA   Meds Ordered and Administered this Visit  Medications - No data to display  BP 116/76 mmHg  Pulse 104  Temp(Src) 98.8 F (37.1 C) (Oral)  SpO2 100% No data found.   Physical Exam NURSES NOTES AND VITAL SIGNS REVIEWED. CONSTITUTIONAL: Well developed, well nourished, no acute distress HEENT: normocephalic, atraumatic, right and left TM's are normal EYES: Conjunctiva normal NECK:normal ROM, supple, no adenopathy PULMONARY:No respiratory distress, normal effort, Lungs: CTAb/l, no wheezes, or increased work of breathing CARDIOVASCULAR: RRR, no murmur ABDOMEN: soft, ND, NT, +'ve BS MUSCULOSKELETAL: Normal ROM of all extremities,  SKIN: warm and dry without rash PSYCHIATRIC: Mood and affect, behavior are normal  ED Course  Procedures (including critical care time)  Labs Review Labs Reviewed - No data to display  Imaging Review No results found.   Visual Acuity Review  Right Eye Distance:   Left Eye Distance:   Bilateral Distance:    Right Eye Near:   Left Eye Near:    Bilateral Near:         MDM   1. Gastritis    Patient is advised to continue home symptomatic treatment. Prescription for Zofran  sent pharmacy  patient has indicated. Patient is advised that if there are new or worsening symptoms or attend the emergency department, or contact primary care provider. Instructions of care provided discharged home in stable condition. Return to work/school note provided.  THIS NOTE WAS GENERATED USING A VOICE RECOGNITION SOFTWARE PROGRAM. ALL REASONABLE EFFORTS  WERE MADE TO PROOFREAD THIS DOCUMENT FOR ACCURACY.     Tharon Aquas, PA 09/04/15 2127

## 2015-09-04 NOTE — Discharge Instructions (Signed)

## 2015-10-28 ENCOUNTER — Telehealth: Payer: Self-pay | Admitting: Internal Medicine

## 2015-10-28 ENCOUNTER — Telehealth: Payer: Self-pay

## 2015-10-28 MED ORDER — ESOMEPRAZOLE MAGNESIUM 40 MG PO CPDR: 40.0000 mg | DELAYED_RELEASE_CAPSULE | Freq: Every day | ORAL | Status: AC

## 2015-10-28 NOTE — Telephone Encounter (Signed)
Refilled Nexium 

## 2015-10-28 NOTE — Telephone Encounter (Signed)
Correct medication was refilled - Nexium, per patient's request.  I documented that it was Omeprazole by mistake

## 2015-10-28 NOTE — Telephone Encounter (Signed)
Refilled Omeprazole 

## 2016-03-19 IMAGING — US US ABDOMEN LIMITED
1 series · 14 of 25 positions shown · non-contrast
Comparison: None.

CLINICAL DATA: 23-year-old male with right upper quadrant pain

EXAM:
US ABDOMEN LIMITED - RIGHT UPPER QUADRANT

[Series 1: us abdomen limited · 0.21mm/px · 14 of 26 slices shown]
[im 1/26]
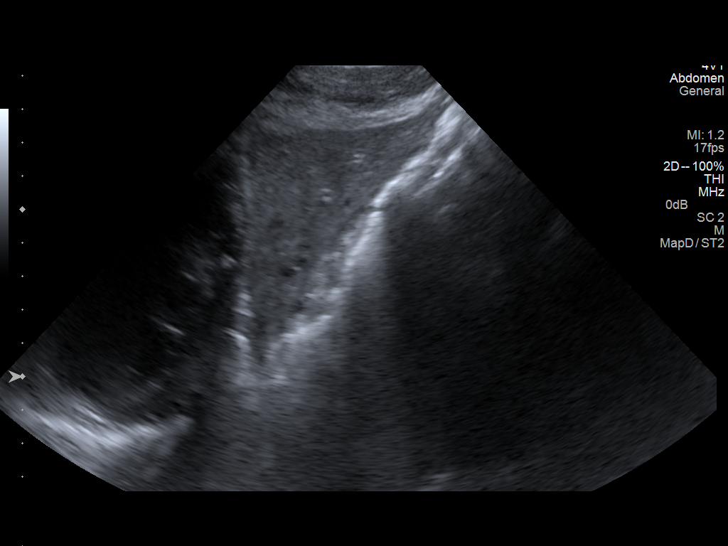
[im 3/26]
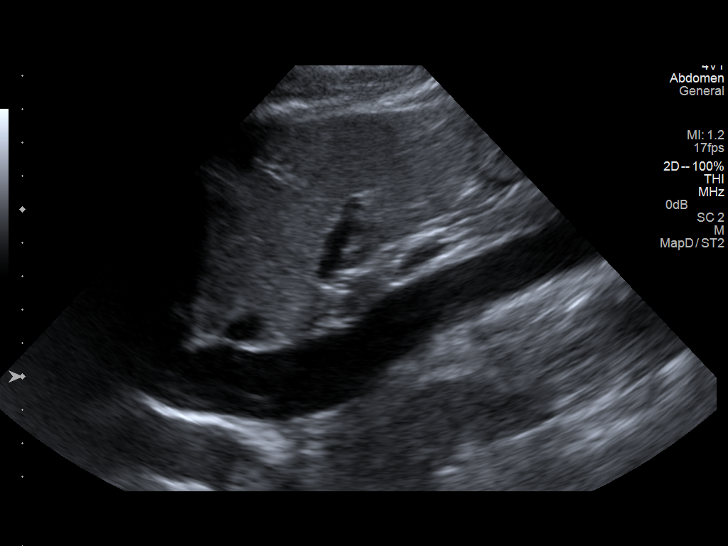
[im 5/26]
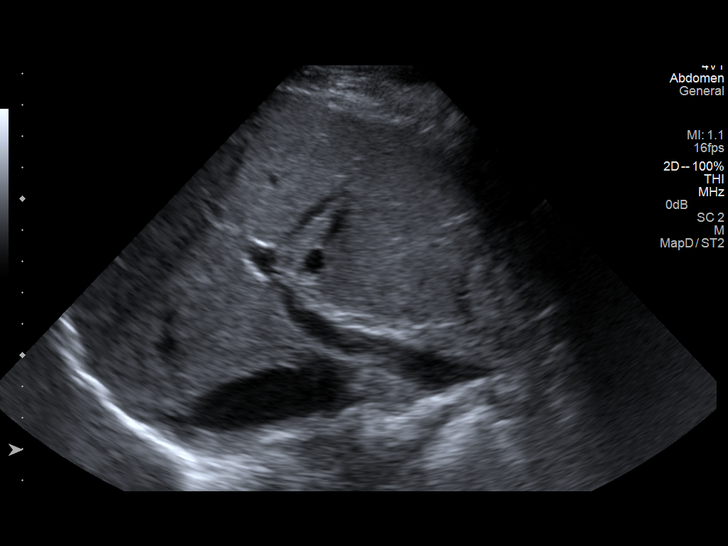
[im 7/26]
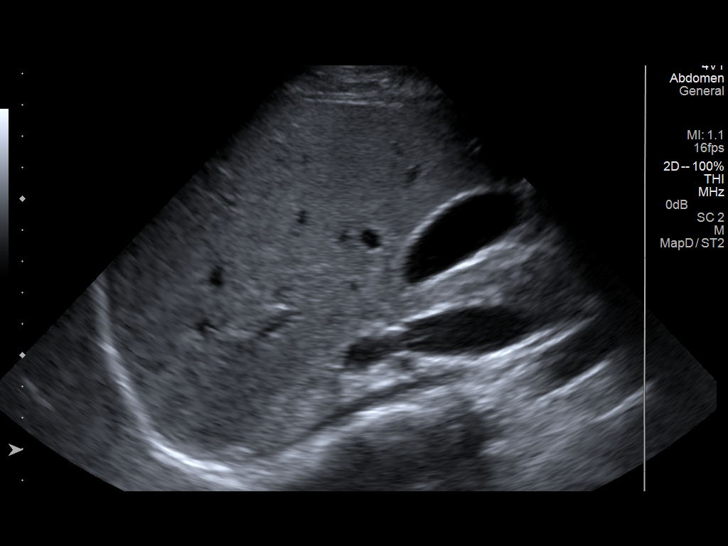
[im 9/26]
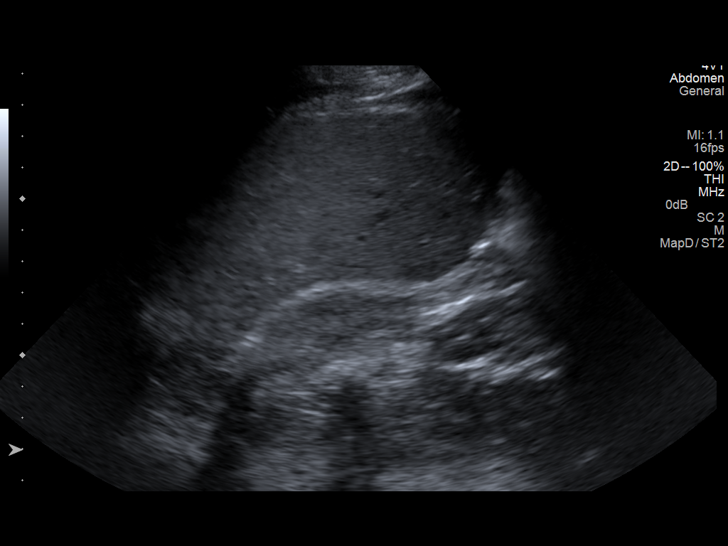
[im 10/26]
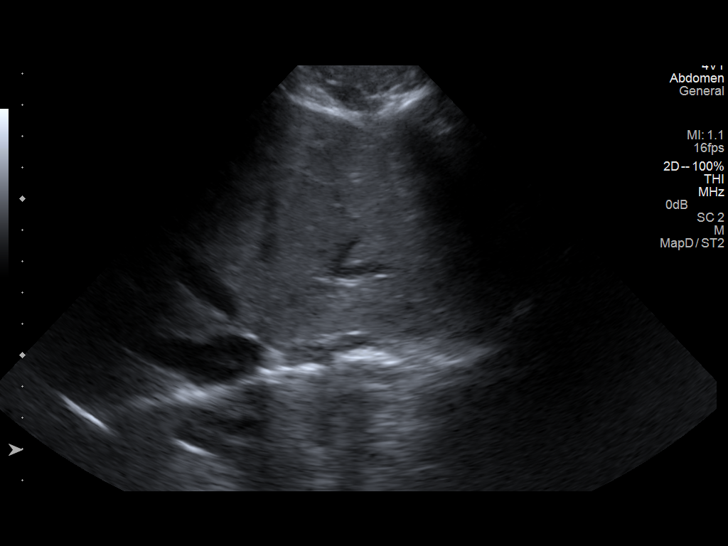
[im 12/26]
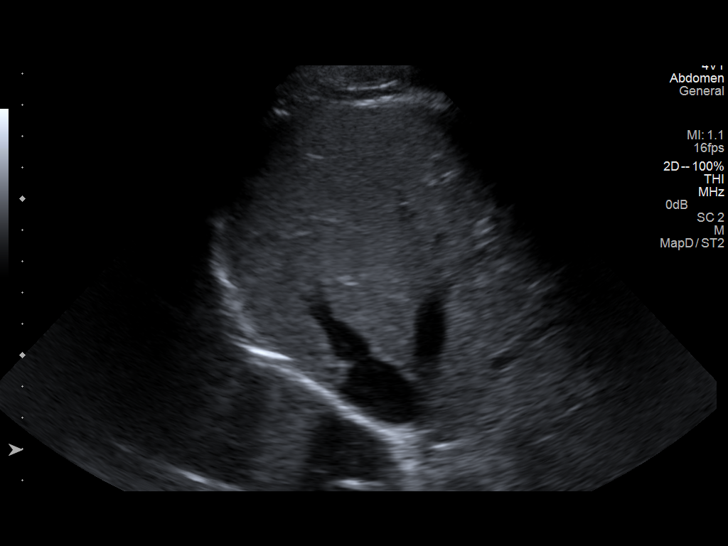
[im 14/26]
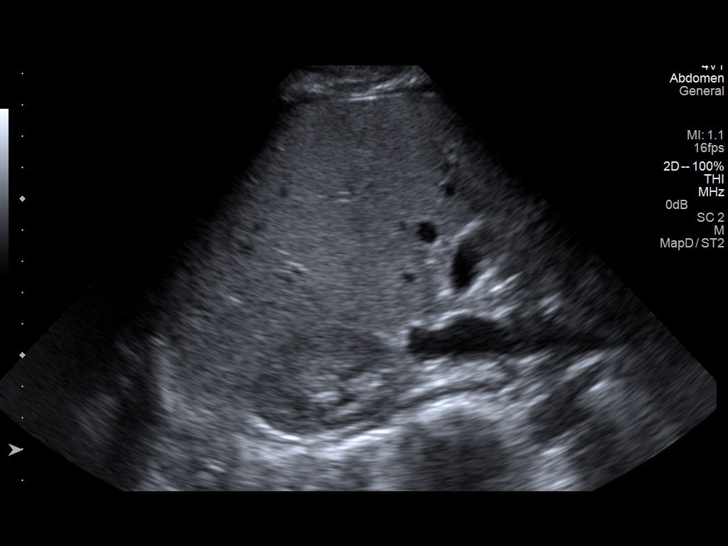
[im 16/26]
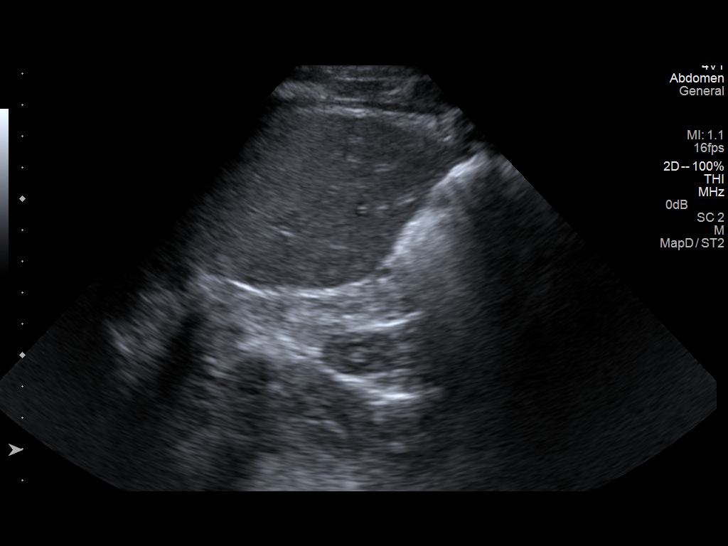
[im 17/26]
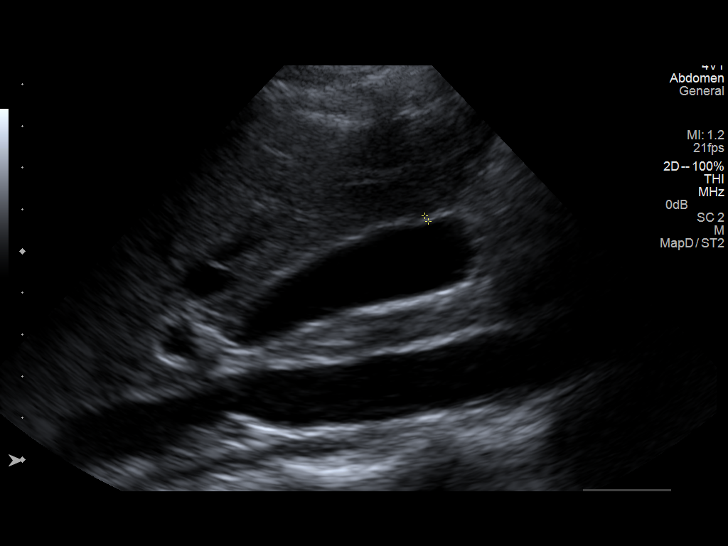
[im 19/26]
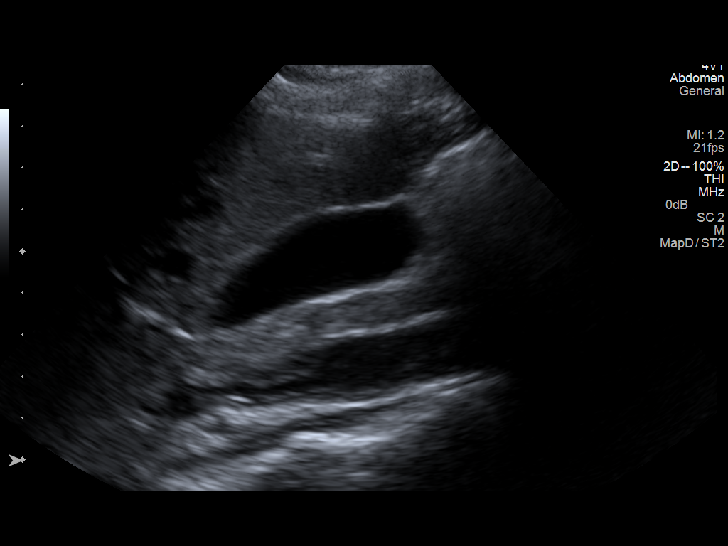
[im 21/26]
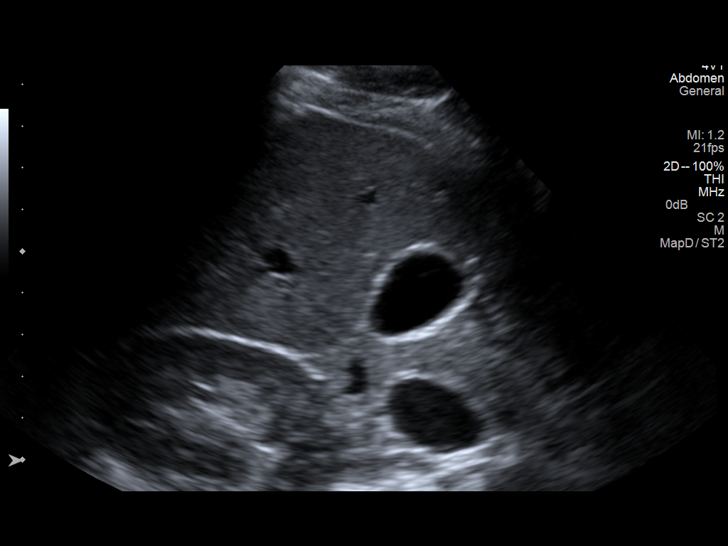
[im 23/26]
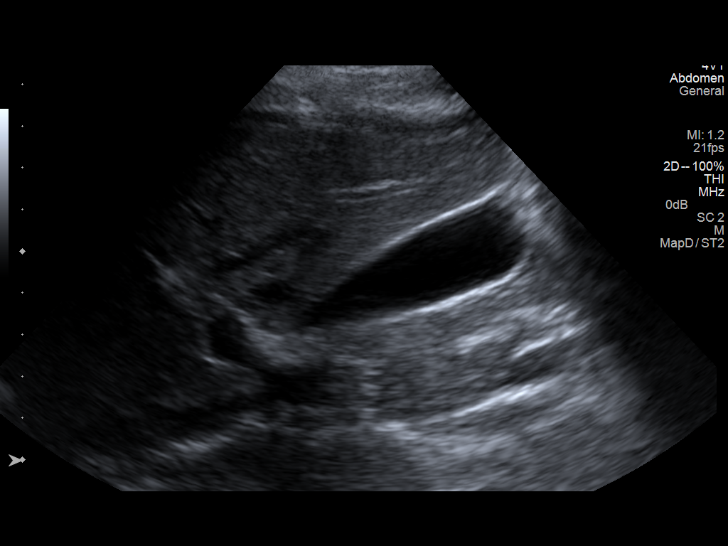
[im 26/26]
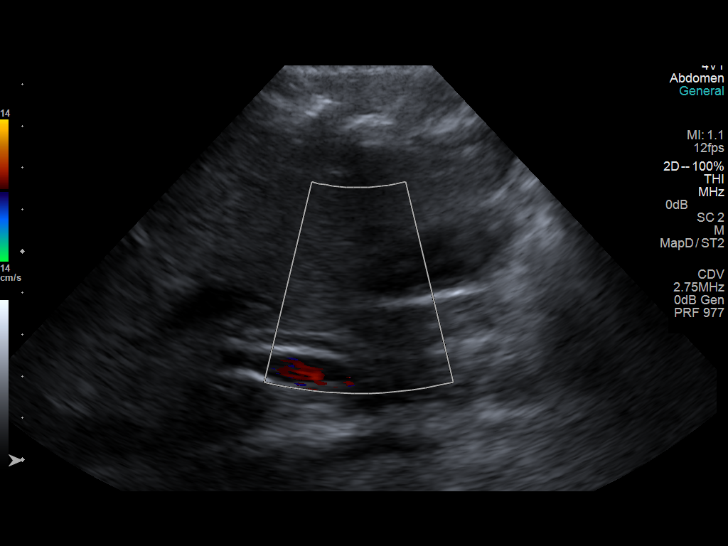

[14 of 25 positions shown; findings below may reference images not displayed]

FINDINGS: Gallbladder:

No gallstones or wall thickening visualized. No sonographic Murphy
sign noted.

Common bile duct:

Diameter: Within normal limits at 3 mm

Liver:

No focal lesion identified. Within normal limits in parenchymal
echogenicity.
IMPRESSION: Negative right upper quadrant ultrasound.

## 2016-05-12 ENCOUNTER — Encounter: Payer: Self-pay | Admitting: Internal Medicine

## 2016-05-12 ENCOUNTER — Ambulatory Visit (INDEPENDENT_AMBULATORY_CARE_PROVIDER_SITE_OTHER)
Admission: RE | Admit: 2016-05-12 | Discharge: 2016-05-12 | Disposition: A | Discharge status: Home / Self Care | Payer: Federal, State, Local not specified - PPO | Source: Ambulatory Visit | Attending: Internal Medicine | Admitting: Internal Medicine

## 2016-05-12 ENCOUNTER — Ambulatory Visit (INDEPENDENT_AMBULATORY_CARE_PROVIDER_SITE_OTHER): Payer: Federal, State, Local not specified - PPO | Admitting: Internal Medicine

## 2016-05-12 DIAGNOSIS — R05 Cough: Secondary | ICD-10-CM | POA: Diagnosis not present

## 2016-05-12 DIAGNOSIS — M940 Chondrocostal junction syndrome [Tietze]: Secondary | ICD-10-CM | POA: Insufficient documentation

## 2016-05-12 DIAGNOSIS — R059 Cough, unspecified: Secondary | ICD-10-CM | POA: Insufficient documentation

## 2016-05-12 MED ORDER — FLUTICASONE FUROATE-VILANTEROL 100-25 MCG/INH IN AEPB
1.0000 | INHALATION_SPRAY | Freq: Every day | RESPIRATORY_TRACT | 5 refills | Status: AC
Start: 2016-05-12 — End: ?

## 2016-05-12 NOTE — Progress Notes (Signed)
Pre visit review using our clinic review tool, if applicable. No additional management support is needed unless otherwise documented below in the visit note. 

## 2016-05-12 NOTE — Progress Notes (Signed)
Subjective:  Patient ID: Alex Hernandez, male    DOB: 03-17-1991  Age: 25 y.o. MRN: [id number]  CC: Chest Pain (bilateral sides when lying down x 1 month. No injuries); Chest Pain (wehn walking and if he turns a certain way, he feels a pop); and Cough (Non productive x 1 mo)   HPI Alex Hernandez presents for dry  cough x 1 mo. C/o anterior CP w/movements - lower sternum  Outpatient Medications Prior to Visit  Medication Sig Dispense Refill  . levofloxacin (LEVAQUIN) 500 MG tablet Take 1 tablet (500 mg total) by mouth daily. 7 tablet 0  . Dextrose-Fructose-Sod Citrate (NAUZENE PO) Take by mouth.    . esomeprazole (NEXIUM) 40 MG capsule Take 1 capsule (40 mg total) by mouth daily at 12 noon. (Patient not taking: Reported on 05/12/2016) 30 capsule 6  . Ibuprofen (ADVIL) 200 MG CAPS Take by mouth.    . ondansetron (ZOFRAN) 4 MG tablet Take 1 tablet (4 mg total) by mouth every 6 (six) hours. (Patient not taking: Reported on 05/12/2016) 12 tablet 0   No facility-administered medications prior to visit.     ROS Review of Systems  Constitutional: Negative for appetite change, fatigue and unexpected weight change.  HENT: Negative for congestion, nosebleeds, sneezing, sore throat and trouble swallowing.   Eyes: Negative for itching and visual disturbance.  Respiratory: Positive for cough. Negative for shortness of breath.   Cardiovascular: Positive for chest pain. Negative for palpitations and leg swelling.  Gastrointestinal: Negative for abdominal distention, blood in stool, diarrhea and nausea.  Genitourinary: Negative for frequency and hematuria.  Musculoskeletal: Negative for back pain, gait problem, joint swelling and neck pain.  Skin: Negative for rash.  Neurological: Negative for dizziness, tremors, speech difficulty and weakness.  Psychiatric/Behavioral: Negative for agitation, dysphoric mood and sleep disturbance. The patient is not nervous/anxious.     Objective:  BP 140/90    Pulse 77   Wt 164 lb (74.4 kg)   SpO2 97%   BMI 25.31 kg/m   BP Readings from Last 3 Encounters:  05/12/16 140/90  09/04/15 116/76  07/02/15 132/80    Wt Readings from Last 3 Encounters:  05/12/16 164 lb (74.4 kg)  07/02/15 164 lb (74.4 kg)  06/10/15 157 lb 9.6 oz (71.5 kg)    Physical Exam  Constitutional: He is oriented to person, place, and time. He appears well-developed. No distress.  NAD  HENT:  Mouth/Throat: Oropharynx is clear and moist.  Eyes: Conjunctivae are normal. Pupils are equal, round, and reactive to light.  Neck: Normal range of motion. No JVD present. No thyromegaly present.  Cardiovascular: Normal rate, regular rhythm, normal heart sounds and intact distal pulses.  Exam reveals no gallop and no friction rub.   No murmur heard. Pulmonary/Chest: Effort normal and breath sounds normal. No respiratory distress. He has no wheezes. He has no rales. He exhibits no tenderness.  Abdominal: Soft. Bowel sounds are normal. He exhibits no distension and no mass. There is no tenderness. There is no rebound and no guarding.  Musculoskeletal: Normal range of motion. He exhibits no edema or tenderness.  Lymphadenopathy:    He has no cervical adenopathy.  Neurological: He is alert and oriented to person, place, and time. He has normal reflexes. No cranial nerve deficit. He exhibits normal muscle tone. He displays a negative Romberg sign. Coordination and gait normal.  Skin: Skin is warm and dry. No rash noted.  Psychiatric: He has a normal mood and affect.  His behavior is normal. Judgment and thought content normal.  Lower sterno-costal junctions are sensitive to palpation   I personally provided Breo inhaler use teaching. After the teaching patient was able to demonstrate it's use effectively. All questions were answered  Lab Results  Component Value Date   WBC 7.0 08/16/2014   HGB 14.3 08/16/2014   HCT 41.6 08/16/2014   PLT 174 08/16/2014   GLUCOSE 105 (H)  08/16/2014   CHOL 156 12/02/2009   TRIG 61.0 12/02/2009   HDL 46.00 12/02/2009   LDLCALC 98 12/02/2009   ALT 22 08/16/2014   AST 25 08/16/2014   NA 138 08/16/2014   K 4.1 08/16/2014   CL 102 08/16/2014   CREATININE 1.14 08/16/2014   BUN 8 08/16/2014   CO2 32 08/16/2014    No results found.  Assessment & Plan:   There are no diagnoses linked to this encounter. I have discontinued Alex Hernandez's levofloxacin. I am also having him maintain his Dextrose-Fructose-Sod Citrate (NAUZENE PO), Ibuprofen, ondansetron, and esomeprazole.  No orders of the defined types were placed in this encounter.    Follow-up: No Follow-up on file.  Sonda PrimesAlex Katrena Stehlin, MD

## 2016-05-12 NOTE — Patient Instructions (Signed)
   Use" Delsym" or" Robitussin" cough syrup varietis for cough.  You can use plain "Tylenol" or "Advil" for fever, chills and achyness. Use Halls or Ricola cough drops.

## 2016-05-12 NOTE — Assessment & Plan Note (Signed)
10/17 x 1 mo --- ?mild asthma Use Breo CXR  Call if not better

## 2016-05-12 NOTE — Assessment & Plan Note (Signed)
CXR Delsium

## 2017-03-27 ENCOUNTER — Ambulatory Visit (INDEPENDENT_AMBULATORY_CARE_PROVIDER_SITE_OTHER): Payer: Federal, State, Local not specified - PPO | Admitting: Family Medicine

## 2017-03-27 ENCOUNTER — Encounter: Payer: Self-pay | Admitting: Family Medicine

## 2017-03-27 VITALS — BP 118/78 | HR 72 | Temp 99.0°F | Resp 14 | Ht 67.5 in | Wt 167.0 lb

## 2017-03-27 DIAGNOSIS — M5442 Lumbago with sciatica, left side: Secondary | ICD-10-CM | POA: Diagnosis not present

## 2017-03-27 DIAGNOSIS — M5441 Lumbago with sciatica, right side: Secondary | ICD-10-CM | POA: Diagnosis not present

## 2017-03-27 DIAGNOSIS — G5701 Lesion of sciatic nerve, right lower limb: Secondary | ICD-10-CM

## 2017-03-27 DIAGNOSIS — G5702 Lesion of sciatic nerve, left lower limb: Secondary | ICD-10-CM

## 2017-03-27 DIAGNOSIS — G5703 Lesion of sciatic nerve, bilateral lower limbs: Secondary | ICD-10-CM

## 2017-03-27 NOTE — Progress Notes (Signed)
Dr. Karleen Hampshire T. Wolfe Camarena, MD, CAQ Sports [facility] [address] [place], [address] Phone: [phone] Fax: [phone]  03/27/2017  Patient: Alex Hernandez, MRN: [id number], DOB: April 02, 1991, 26 y.o.  Primary Physician:  Tresa Garter, MD   Chief Complaint  Patient presents with  . Back Pain    sharpe pain with bending or standing; shoot downs back of legs;  denies urinary symptoms    Subjective:   Alex Hernandez is a 26 y.o. very pleasant male patient who presents with the following:  Sharp pain radiating down both sides. Reports an accident in 2016.   Pain goes down to behind the knees B.  No loss of b/b control.  He reports no prior history of significant back pain. Exam some mild pain in his lower back, and also is having some pain in his buttocks. Pain is down approximately to the level of the knee.  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  Alleve, tylenol, advil.   He doesn't do any type of physical activity at all.  He does go to church and dances around [employer] every week.  Past Medical History, Surgical History, Social History, Family History, Problem List, Medications, and Allergies have been reviewed and updated if relevant.  Patient Active Problem List   Diagnosis Date Noted  . Costochondritis 05/12/2016  . GERD (gastroesophageal reflux disease) 07/18/2013  . 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 Dysphagia, unspecified(787.20) 09/13/2012  . LEG PAIN 05/30/2010  . GERD 04/21/2010    Past Medical History:  Diagnosis Date  . Asthma   . GERD (gastroesophageal reflux disease)     No past surgical history on file.  Social History   Social History  . Marital status: Single    Spouse name: N/A  . Number of children: 0  . Years of education: N/A   Occupational History  . college student -- [employer]    Social History Main Topics  . Smoking status: Never Smoker  . Smokeless tobacco: Never Used  . Alcohol use No  .  Drug use: No  . Sexual activity: Yes   Other Topics Concern  . Not on file   Social History Narrative  . No narrative on file    Family History  Problem Relation Age of Onset  . Cholelithiasis Mother   . Asthma Mother   . Scoliosis Mother   . GER disease Mother   . Heart defect Father        MVP  . Cholecystitis Maternal Aunt   . Diabetes Maternal Grandfather        borderline  . Peptic Ulcer Maternal Grandfather   . Lung cancer Maternal Uncle        great  . Prostate cancer Paternal Uncle   . Lung cancer Paternal Grandfather   . Kidney failure Paternal Grandmother   . Hypertension Paternal Grandmother   . Hypertension Maternal Grandmother   . Asthma Maternal Grandmother   . Asthma Brother     Allergies  Allergen Reactions  . Penicillins     REACTION: Hives    Medication list reviewed and updated in full in [facility].  GEN: no acute illness or fever CV: No chest pain or shortness of breath MSK: detailed above Neuro: neurological signs are described above ROS O/w per HPI  Objective:   BP 118/78 (BP Location: Left Arm, Patient Position: Sitting, Cuff Size: Normal)   Pulse 72   Temp 99 F (  37.2 C) (Oral)   Resp 14   Ht 5' 7.5" (1.715 m)   Wt 167 lb (75.8 kg)   BMI 25.77 kg/m    GEN: Well-developed,well-nourished,in no acute distress; alert,appropriate and cooperative throughout examination HEENT: Normocephalic and atraumatic without obvious abnormalities. Ears, externally no deformities PULM: Breathing comfortably in no respiratory distress EXT: No clubbing, cyanosis, or edema PSYCH: Normally interactive. Cooperative during the interview. Pleasant. Friendly and conversant. Not anxious or depressed appearing. Normal, full affect.  Range of motion at  the waist: Flexion: normal Extension: normal Lateral bending: normal Rotation: all normal  No echymosis or edema Rises to examination table with no difficulty Gait: non  antalgic  Inspection/Deformity: N Paraspinus Tenderness: mild tenderness at L4-S1 bilaterally.  B Ankle Dorsiflexion (L5,4): 5/5 B Great Toe Dorsiflexion (L5,4): 5/5 Heel Walk (L5): WNL Toe Walk (S1): WNL Rise/Squat (L4): WNL  SENSORY B Medial Foot (L4): WNL B Dorsum (L5): WNL B Lateral (S1): WNL Light Touch: WNL Pinprick: WNL  REFLEXES Knee (L4): 2+ Ankle (S1): 2+  B SLR, seated: neg B SLR, supine: neg B FABER: neg B Reverse FABER: + on the L B Greater Troch: NT B Log Roll: neg B Stork: NT B Sciatic Notch: NT   Radiology: No results found.  Assessment and Plan:   Acute low back pain with bilateral sciatica, unspecified back pain laterality  Piriformis syndrome of both sides  >25 minutes spent in face to face time with patient, >50% spent in counselling or coordination of care   Sciatica symptoms to the knee.  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  This pathology cannot fully be excluded, but would be less likely in this picture.  I went over a fairly extensive rehabilitation program with the patient, and I would anticipate that he'll have an approximate 75% chance of improvement in the next 3 weeks.  Follow-up: No Follow-up on file.  Medications Discontinued During This Encounter  Medication Reason  . ondansetron (ZOFRAN) 4 MG tablet Completed Course   Signed,  Heron Pitcock T. Aerika Groll, MD   Patient's Medications  New Prescriptions   No medications on file  Previous Medications   DEXTROSE-FRUCTOSE-SOD CITRATE (NAUZENE PO)    Take by mouth.   ESOMEPRAZOLE (NEXIUM) 40 MG CAPSULE    Take 1 capsule (40 mg total) by mouth daily at 12 noon.   FLUTICASONE FUROATE-VILANTEROL (BREO ELLIPTA) 100-25 MCG/INH AEPB    Inhale 1 puff into the lungs daily.   IBUPROFEN (ADVIL) 200 MG CAPS    Take by mouth.  Modified Medications   No medications on file   Discontinued Medications   ONDANSETRON (ZOFRAN) 4 MG TABLET    Take 1 tablet (4 mg total) by mouth every 6 (six) hours.

## 2017-03-27 NOTE — Progress Notes (Signed)
Pre visit review using our clinic review tool, if applicable. No additional management support is needed unless otherwise documented below in the visit note. 

## 2017-03-27 NOTE — Patient Instructions (Signed)

## 2017-08-04 ENCOUNTER — Telehealth: Payer: Self-pay | Admitting: Internal Medicine

## 2017-08-04 ENCOUNTER — Telehealth: Payer: Self-pay | Admitting: Family

## 2017-08-04 ENCOUNTER — Other Ambulatory Visit: Payer: Self-pay | Admitting: Family

## 2017-08-04 MED ORDER — OSELTAMIVIR PHOSPHATE 75 MG PO CAPS
75.0000 mg | ORAL_CAPSULE | Freq: Every day | ORAL | 0 refills | Status: DC
Start: 2017-08-04 — End: 2017-08-05

## 2017-08-04 NOTE — Telephone Encounter (Signed)
Copied from CRM [id number]. Topic: Quick Communication - Rx Refill/Question >> Aug 04, 2017  5:28 PM Everardo Pacific[place]oton, Michiah Masse, [place] wrote: Medication: Tamiflu  Has the patient contacted their pharmacy? Yes Pharmacy called because they didn't receive the script for the Tamiflu for this patient. Did receive scripts for the other family members   Preferred Pharmacy (with phone number or street name): [facility]  [address] [phone]   Agent: Please be advised that RX refills may take up to 3 business days. We ask that you follow-up with your pharmacy.

## 2017-08-04 NOTE — Telephone Encounter (Signed)
Mother is concerned about him getting flu; brother is in office and has documented flu; this patient did not get flu shot; will call in Tamiflu 75 mg qd x 10 days; follow-up as needed.

## 2017-08-05 MED ORDER — OSELTAMIVIR PHOSPHATE 75 MG PO CAPS: 75.0000 mg | ORAL_CAPSULE | Freq: Every day | ORAL | 0 refills | Status: DC

## 2017-08-05 NOTE — Telephone Encounter (Signed)
Per chart rx was sent to the wrong pharmacy. NP sent rx to [facility]. Resent tp [facility] [address]...Raechel Chute/lmb

## 2017-08-25 ENCOUNTER — Ambulatory Visit: Payer: BC Managed Care – PPO | Admitting: Internal Medicine

## 2017-08-25 ENCOUNTER — Encounter: Payer: Self-pay | Admitting: Internal Medicine

## 2017-08-25 DIAGNOSIS — K219 Gastro-esophageal reflux disease without esophagitis: Secondary | ICD-10-CM | POA: Diagnosis not present

## 2017-08-25 MED ORDER — HYOSCYAMINE SULFATE 0.125 MG PO TABS: 0.1250 mg | ORAL_TABLET | ORAL | 3 refills | Status: AC | PRN

## 2017-08-25 NOTE — Patient Instructions (Signed)
  Gluten free trial for 4-6 weeks. OK to use gluten-free bread and gluten-free pasta.    Gluten-Free Diet for Celiac Disease, Adult The gluten-free diet includes all foods that do not contain gluten. Gluten is a protein that is found in wheat, rye, barley, and some other grains. Following the gluten-free diet is the only treatment for people with celiac disease. It helps to prevent damage to the intestines and improves or eliminates the symptoms of celiac disease. Following the gluten-free diet requires some planning. It can be challenging at first, but it gets easier with time and practice. There are more gluten-free options available today than ever before. If you need help finding gluten-free foods or if you have questions, talk with your diet and nutrition specialist (registered dietitian) or your health care provider. What do I need to know about a gluten-free diet?  All fruits, vegetables, and meats are safe to eat and do not contain gluten.  When grocery shopping, start by shopping in the produce, meat, and dairy sections. These sections are more likely to contain gluten-free foods. Then move to the aisles that contain packaged foods if you need to.  Read all food labels. Gluten is often added to foods. Always check the ingredient list and look for warnings, such as "may contain gluten."  Talk with your dietitian or health care provider before taking a gluten-free multivitamin or mineral supplement.  Be aware of gluten-free foods having contact with foods that contain gluten (cross-contamination). This can happen at home and with any processed foods. ? Talk with your health care provider or dietitian about how to reduce the risk of cross-contamination in your home. ? If you have questions about how a food is processed, ask the manufacturer. What key words help to identify gluten? Foods that list any of these key words on the label usually contain gluten:  Wheat, flour, enriched  flour, bromated flour, white flour, durum flour, graham flour, phosphated flour, self-rising flour, semolina, farina, barley (malt), rye, and oats.  Starch, dextrin, modified food starch, or cereal.  Thickening, fillers, or emulsifiers.  Malt flavoring, malt extract, or malt syrup.  Hydrolyzed vegetable protein.  In the [place] packaged foods that are gluten-free are required to be labeled "GF." These foods should be easy to identify and are safe to eat. In the [place] food companies are also required to list common food allergens, including wheat, on their labels. Recommended foods Grains  Amaranth, bean flours, 100% buckwheat flour, corn, millet, nut flours or nut meals, GF oats, quinoa, rice, sorghum, teff, rice wafers, pure cornmeal tortillas, popcorn, and hot cereals made from cornmeal. Hominy, rice, wild rice. Some Asian rice noodles or bean noodles. Arrowroot starch, corn bran, corn flour, corn germ, cornmeal, corn starch, potato flour, potato starch flour, and rice bran. Plain, brown, and sweet rice flours. Rice polish, soy flour, and tapioca starch. Vegetables  All plain fresh, frozen, and canned vegetables. Fruits  All plain fresh, frozen, canned, and dried fruits, and 100% fruit juices. Meats and other protein foods  All fresh beef, pork, poultry, fish, seafood, and eggs. Fish canned in water, oil, brine, or vegetable broth. Plain nuts and seeds, peanut butter. Some lunch meat and some frankfurters. Dried beans, dried peas, and lentils. Dairy  Fresh plain, dry, evaporated, or condensed milk. Cream, butter, sour cream, whipping cream, and most yogurts. Unprocessed cheese, most processed cheeses, some cottage cheese, some cream cheeses. Beverages  Coffee, tea, most herbal teas. Carbonated beverages and some root beers.   Wine, sake, and distilled spirits, such as gin, vodka, and whiskey. Most hard ciders. Fats and oils  Butter, margarine, vegetable oil, hydrogenated butter, olive  oil, shortening, lard, cream, and some mayonnaise. Some commercial salad dressings. Olives. Sweets and desserts  Sugar, honey, some syrups, molasses, jelly, and jam. Plain hard candy, marshmallows, and gumdrops. Pure cocoa powder. Plain chocolate. Custard and some pudding mixes. Gelatin desserts, sorbets, frozen ice pops, and sherbet. Cake, cookies, and other desserts prepared with allowed flours. Some commercial ice creams. Cornstarch, tapioca, and rice puddings. Seasoning and other foods  Some canned or frozen soups. Monosodium glutamate (MSG). Cider, rice, and wine vinegar. Baking soda and baking powder. Cream of tartar. Baking and nutritional yeast. Certain soy sauces made without wheat (ask your dietitian about specific brands that are allowed). Nuts, coconut, and chocolate. Salt, pepper, herbs, spices, flavoring extracts, imitation or artificial flavorings, natural flavorings, and food colorings. Some medicines and supplements. Some lip glosses and other cosmetics. Rice syrups. The items listed may not be a complete list. Talk with your dietitian about what dietary choices are best for you. Foods to avoid Grains  Barley, bran, bulgur, couscous, cracked wheat, [place], farro, graham, malt, matzo, semolina, wheat germ, and all wheat and rye cereals including spelt and kamut. Cereals containing malt as a flavoring, such as rice cereal. Noodles, spaghetti, macaroni, most packaged rice mixes, and all mixes containing wheat, rye, barley, or triticale. Vegetables  Most creamed vegetables and most vegetables canned in sauces. Some commercially prepared vegetables and salads. Fruits  Thickened or prepared fruits and some pie fillings. Some fruit snacks and fruit roll-ups. Meats and other protein foods  Any meat or meat alternative containing wheat, rye, barley, or gluten stabilizers. These are often marinated or packaged meats and lunch meats. Bread-containing products, such as Swiss steak,  croquettes, meatballs, and meatloaf. Most tuna canned in vegetable broth and turkey with hydrolyzed vegetable protein (HVP) injected as part of the basting. Seitan. Imitation fish. Eggs in sauces made from ingredients to avoid. Dairy  Commercial chocolate milk drinks and malted milk. Some non-dairy creamers. Any cheese product containing ingredients to avoid. Beverages  Certain cereal beverages. Beer, ale, malted milk, and some root beers. Some hard ciders. Some instant flavored coffees. Some herbal teas made with barley or with barley malt added. Fats and oils  Some commercial salad dressings. Sour cream containing modified food starch. Sweets and desserts  Some toffees. Chocolate-coated nuts (may be rolled in wheat flour) and some commercial candies and candy bars. Most cakes, cookies, donuts, pastries, and other baked goods. Some commercial ice cream. Ice cream cones. Commercially prepared mixes for cakes, cookies, and other desserts. Bread pudding and other puddings thickened with flour. Products containing brown rice syrup made with barley malt enzyme. Desserts and sweets made with malt flavoring. Seasoning and other foods  Some curry powders, some dry seasoning mixes, some gravy extracts, some meat sauces, some ketchups, some prepared mustards, and horseradish. Certain soy sauces. Malt vinegar. Bouillon and bouillon cubes that contain HVP. Some chip dips, and some chewing gum. Yeast extract. Brewer's yeast. Caramel color. Some medicines and supplements. Some lip glosses and other cosmetics. The items listed may not be a complete list. Talk with your dietitian about what dietary choices are best for you. Summary  Gluten is a protein that is found in wheat, rye, barley, and some other grains. The gluten-free diet includes all foods that do not contain gluten.  If you need help finding gluten-free foods or if   you have questions, talk with your diet and nutrition specialist (registered  dietitian) or your health care provider.  Read all food labels. Gluten is often added to foods. Always check the ingredient list and look for warnings, such as "may contain gluten." This information is not intended to replace advice given to you by your health care provider. Make sure you discuss any questions you have with your health care provider. Document Released: 07/06/2005 Document Revised: 04/20/2016 Document Reviewed: 04/20/2016 Elsevier Interactive Patient Education  2018 [employer]   

## 2017-08-25 NOTE — Assessment & Plan Note (Signed)
Nexium prn ?food intolerance Gluten free diet Dr Marina GoodellPerry if not better

## 2017-08-25 NOTE — Progress Notes (Signed)
Subjective:  Patient ID: Alex Hernandez, male    DOB: 01/04/1991  Age: 27 y.o. MRN: [id number]  CC: No chief complaint on file.   HPI Alex N Blakely presents for epig pain since Fri He had GERD - nexium helped Pain was 7/10. It started after lunch  Outpatient Medications Prior to Visit  Medication Sig Dispense Refill  . Dextrose-Fructose-Sod Citrate (NAUZENE PO) Take by mouth.    . esomeprazole (NEXIUM) 40 MG capsule Take 1 capsule (40 mg total) by mouth daily at 12 noon. 30 capsule 6  . fluticasone furoate-vilanterol (BREO ELLIPTA) 100-25 MCG/INH AEPB Inhale 1 puff into the lungs daily. 1 each 5  . Ibuprofen (ADVIL) 200 MG CAPS Take by mouth.    . oseltamivir (TAMIFLU) 75 MG capsule Take 1 capsule (75 mg total) by mouth daily. 10 capsule 0   No facility-administered medications prior to visit.     ROS Review of Systems  Constitutional: Negative for appetite change, fatigue and unexpected weight change.  HENT: Negative for congestion, nosebleeds, sneezing, sore throat and trouble swallowing.   Eyes: Negative for itching and visual disturbance.  Respiratory: Negative for cough.   Cardiovascular: Negative for chest pain, palpitations and leg swelling.  Gastrointestinal: Positive for abdominal pain. Negative for abdominal distention, blood in stool, diarrhea and nausea.  Genitourinary: Negative for frequency and hematuria.  Musculoskeletal: Negative for back pain, gait problem, joint swelling and neck pain.  Skin: Negative for rash.  Neurological: Negative for dizziness, tremors, speech difficulty and weakness.  Psychiatric/Behavioral: Negative for agitation, dysphoric mood and sleep disturbance. The patient is not nervous/anxious.   GERD x's  Objective:  BP 122/78 (BP Location: Left Arm, Patient Position: Sitting, Cuff Size: Normal)   Pulse 66   Temp 98.8 F (37.1 C) (Oral)   Ht 5' 7.5" (1.715 m)   Wt 167 lb (75.8 kg)   SpO2 98%   BMI 25.77 kg/m   BP Readings from  Last 3 Encounters:  08/25/17 122/78  03/27/17 118/78  05/12/16 140/90    Wt Readings from Last 3 Encounters:  08/25/17 167 lb (75.8 kg)  03/27/17 167 lb (75.8 kg)  05/12/16 164 lb (74.4 kg)    Physical Exam  Constitutional: He is oriented to person, place, and time. He appears well-developed. No distress.  NAD  HENT:  Mouth/Throat: Oropharynx is clear and moist.  Eyes: Conjunctivae are normal. Pupils are equal, round, and reactive to light.  Neck: Normal range of motion. No JVD present. No thyromegaly present.  Cardiovascular: Normal rate, regular rhythm, normal heart sounds and intact distal pulses. Exam reveals no gallop and no friction rub.  No murmur heard. Pulmonary/Chest: Effort normal and breath sounds normal. No respiratory distress. He has no wheezes. He has no rales. He exhibits no tenderness.  Abdominal: Soft. Bowel sounds are normal. He exhibits no distension and no mass. There is no tenderness. There is no rebound and no guarding.  Musculoskeletal: Normal range of motion. He exhibits no edema or tenderness.  Lymphadenopathy:    He has no cervical adenopathy.  Neurological: He is alert and oriented to person, place, and time. He has normal reflexes. No cranial nerve deficit. He exhibits normal muscle tone. He displays a negative Romberg sign. Coordination and gait normal.  Skin: Skin is warm and dry. No rash noted.  Psychiatric: He has a normal mood and affect. His behavior is normal. Judgment and thought content normal.    Lab Results  Component Value Date   WBC  7.0 08/16/2014   HGB 14.3 08/16/2014   HCT 41.6 08/16/2014   PLT 174 08/16/2014   GLUCOSE 105 (H) 08/16/2014   CHOL 156 12/02/2009   TRIG 61.0 12/02/2009   HDL 46.00 12/02/2009   LDLCALC 98 12/02/2009   ALT 22 08/16/2014   AST 25 08/16/2014   NA 138 08/16/2014   K 4.1 08/16/2014   CL 102 08/16/2014   CREATININE 1.14 08/16/2014   BUN 8 08/16/2014   CO2 32 08/16/2014    Dg Chest 2 View  Result  Date: 05/12/2016 CLINICAL DATA:  Cough for 1 month. Asthma. Gastroesophageal reflux disease. EXAM: CHEST  2 VIEW COMPARISON:  None. FINDINGS: The heart size and mediastinal contours are within normal limits. Both lungs are clear. No evidence of pneumothorax or pleural effusion. The visualized skeletal structures are unremarkable. IMPRESSION: Negative.  No active cardiopulmonary disease. Electronically Signed   By: Myles Rosenthal M.D.   On: 05/12/2016 11:04    Assessment & Plan:   There are no diagnoses linked to this encounter. I have discontinued Alex Hernandez's oseltamivir. I am also having him maintain his Dextrose-Fructose-Sod Citrate (NAUZENE PO), Ibuprofen, esomeprazole, and fluticasone furoate-vilanterol.  No orders of the defined types were placed in this encounter.    Follow-up: No Follow-up on file.  Sonda Primes, MD

## 2017-09-15 ENCOUNTER — Encounter: Payer: Self-pay | Admitting: Family

## 2017-09-15 ENCOUNTER — Ambulatory Visit: Payer: BC Managed Care – PPO | Admitting: Family

## 2017-09-15 VITALS — BP 122/80 | HR 79 | Temp 98.6°F | Ht 67.0 in | Wt 171.0 lb

## 2017-09-15 DIAGNOSIS — J019 Acute sinusitis, unspecified: Secondary | ICD-10-CM | POA: Diagnosis not present

## 2017-09-15 MED ORDER — FLUTICASONE PROPIONATE 50 MCG/ACT NA SUSP: 2.0000 | Freq: Every day | NASAL | 6 refills | Status: AC

## 2017-09-15 MED ORDER — AZITHROMYCIN 250 MG PO TABS: ORAL_TABLET | ORAL | 0 refills | Status: DC

## 2017-09-15 NOTE — Progress Notes (Signed)
Alex Hernandez is a 27 y.o. male with the following history as recorded in [employer]:  Patient Active Problem List   Diagnosis Date Noted  . Costochondritis 05/12/2016  . GERD (gastroesophageal reflux disease) 07/18/2013  . 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 Dysphagia, unspecified(787.20) 09/13/2012  . LEG PAIN 05/30/2010    Current Outpatient Medications  Medication Sig Dispense Refill  . Dextrose-Fructose-Sod Citrate (NAUZENE PO) Take by mouth.    . esomeprazole (NEXIUM) 40 MG capsule Take 1 capsule (40 mg total) by mouth daily at 12 noon. 30 capsule 6  . fluticasone furoate-vilanterol (BREO ELLIPTA) 100-25 MCG/INH AEPB Inhale 1 puff into the lungs daily. 1 each 5  . Ibuprofen (ADVIL) 200 MG CAPS Take by mouth.    Marland Kitchen. azithromycin (ZITHROMAX) 250 MG tablet 2 tabs po qd x 1 day; 1 tablet per day x 4 days; 6 tablet 0  . fluticasone (FLONASE) 50 MCG/ACT nasal spray Place 2 sprays into both nostrils daily. 16 g 6  . hyoscyamine (LEVSIN, ANASPAZ) 0.125 MG tablet Take 1-2 tablets (0.125-0.25 mg total) by mouth every 4 (four) hours as needed for up to 10 days. 60 tablet 3   No current facility-administered medications for this visit.     Allergies: Penicillins  Past Medical History:  Diagnosis Date  . Asthma   . GERD (gastroesophageal reflux disease)     History reviewed. No pertinent surgical history.  Family History  Problem Relation Age of Onset  . Cholelithiasis Mother   . Asthma Mother   . Scoliosis Mother   . GER disease Mother   . Heart defect Father        MVP  . Cholecystitis Maternal Aunt   . Diabetes Maternal Grandfather        borderline  . Peptic Ulcer Maternal Grandfather   . Lung cancer Maternal Uncle        great  . Prostate cancer Paternal Uncle   . Lung cancer Paternal Grandfather   . Kidney failure Paternal Grandmother   . Hypertension Paternal Grandmother   . Hypertension Maternal Grandmother   . Asthma Maternal Grandmother   . Asthma Brother     Social  History   Tobacco Use  . Smoking status: Never Smoker  . Smokeless tobacco: Never Used  Substance Use Topics  . Alcohol use: No    Subjective:  1 week history of cough/ congestion; + productive cough; started with watery eyes on Monday; using OTC Sudafed; no chest pain or shortness of breath; complaining of sinus pain/ pressure; ears feel full/ congested;     Objective:  Vitals:   09/15/17 1319  BP: 122/80  Pulse: 79  Temp: 98.6 F (37 C)  TempSrc: Oral  SpO2: 98%  Weight: 171 lb (77.6 kg)  Height: 5\' 7"  (1.702 m)    General: Well developed, well nourished, in no acute distress  Skin : Warm and dry.  Head: Normocephalic and atraumatic  Eyes: Sclera and conjunctiva clear; pupils round and reactive to light; extraocular movements intact  Ears: External normal; canals clear; tympanic membranes congested bilaterally Oropharynx: Pink, supple. No suspicious lesions  Neck: Supple without thyromegaly, adenopathy  Lungs: Respirations unlabored; clear to auscultation bilaterally without wheeze, rales, rhonchi  CVS exam: normal rate and regular rhythm.  Neurologic: Alert and oriented; speech intact; face symmetrical; moves all extremities well; CNII-XII intact without focal deficit   Assessment:  1. Acute sinusitis, recurrence not specified, unspecified location     Plan:  Rx for Z-pak and Flonase;  increase fluids, rest and follow-up worse, no better.  No Follow-up on file.  No orders of the defined types were placed in this encounter.   Requested Prescriptions   Signed Prescriptions Disp Refills  . azithromycin (ZITHROMAX) 250 MG tablet 6 tablet 0    Sig: 2 tabs po qd x 1 day; 1 tablet per day x 4 days;  . fluticasone (FLONASE) 50 MCG/ACT nasal spray 16 g 6    Sig: Place 2 sprays into both nostrils daily.

## 2017-12-14 IMAGING — DX DG CHEST 2V
2 series · 2 of 2 positions shown · non-contrast
Comparison: None.

CLINICAL DATA: Cough for 1 month. Asthma. Gastroesophageal reflux
disease.

EXAM:
CHEST  2 VIEW

[chest pa]
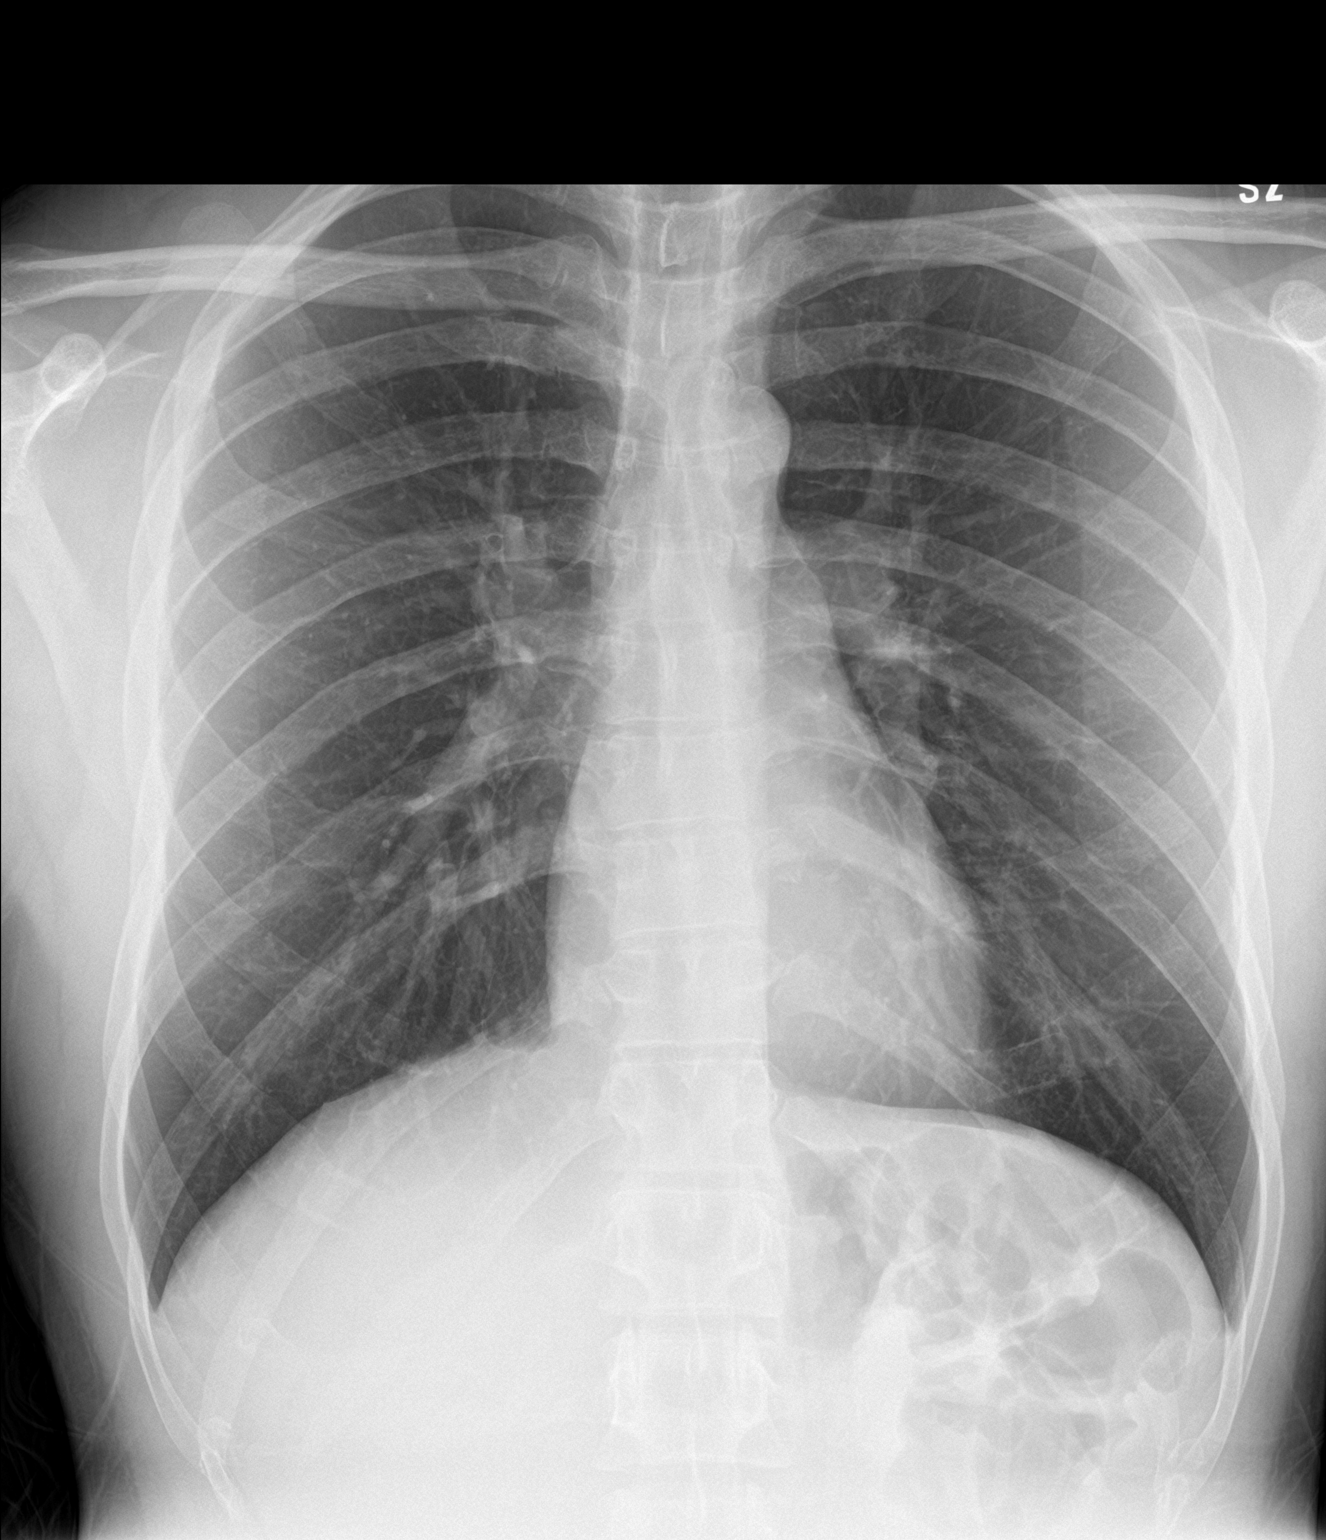

[chest lat]
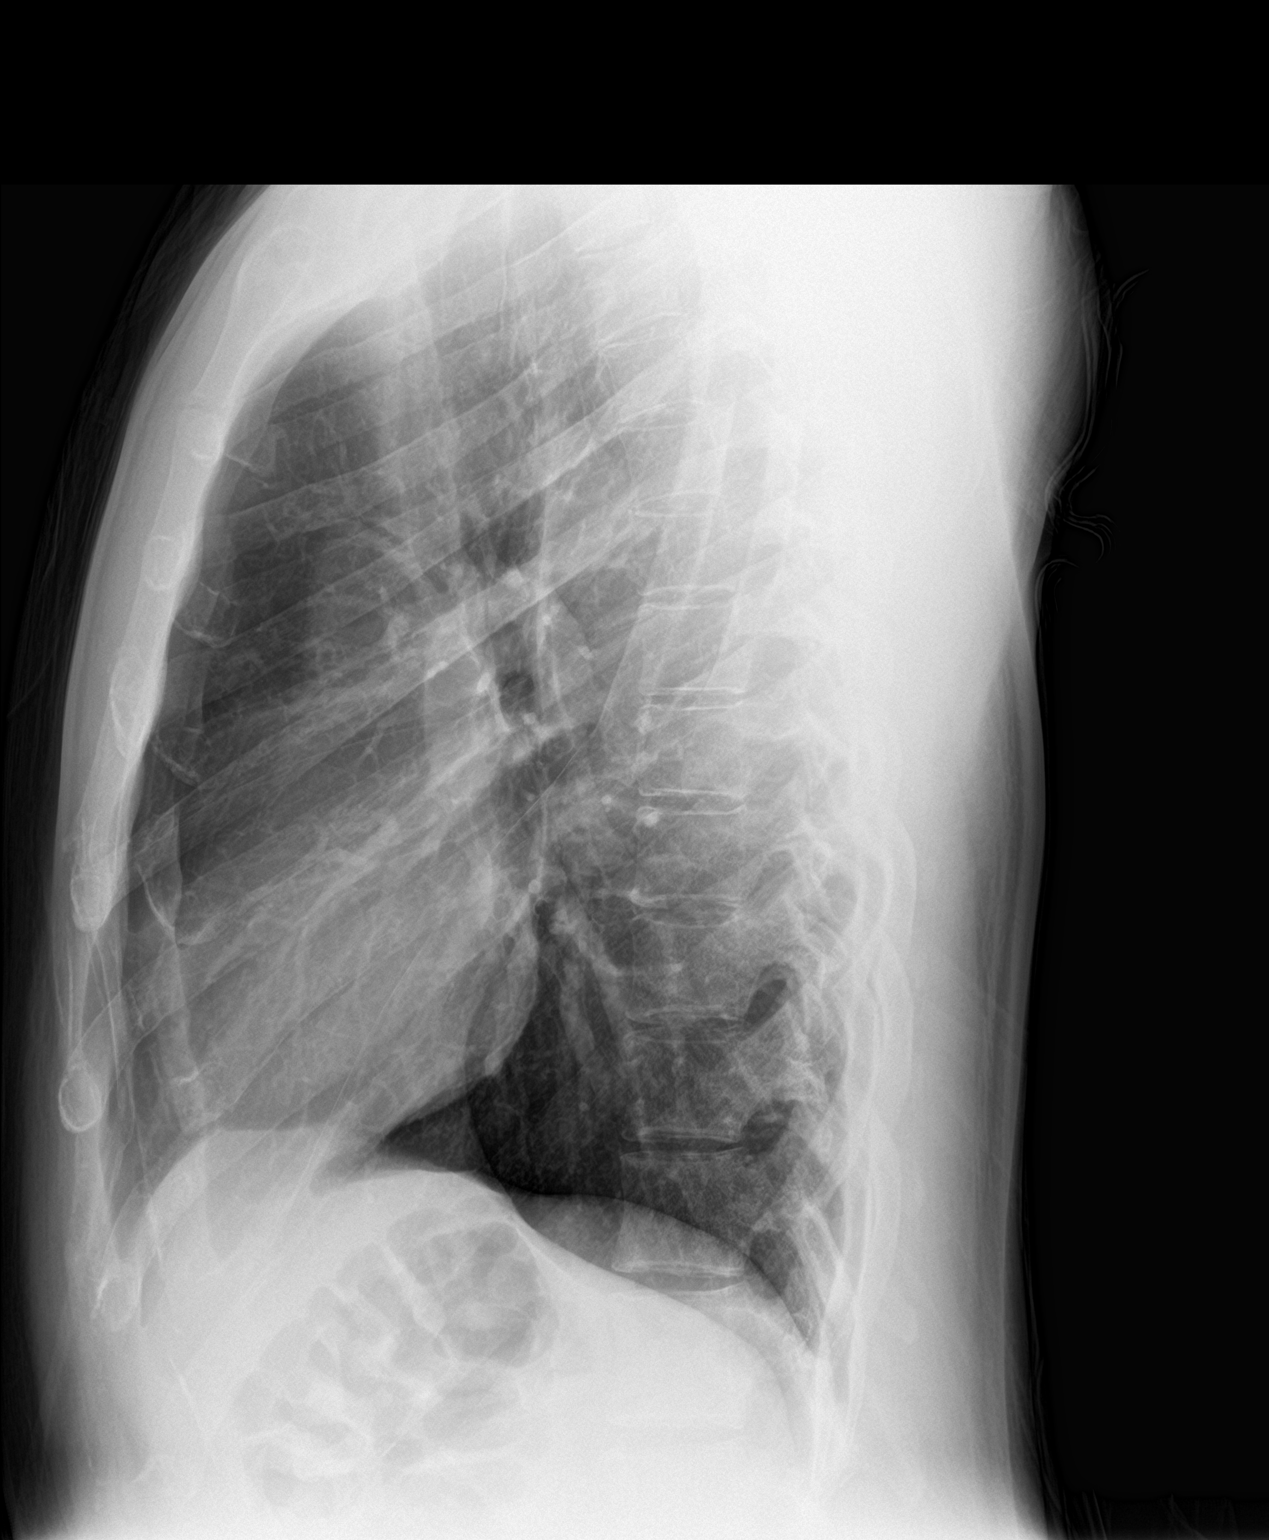

[2 of 2 positions shown; findings below may reference images not displayed]

FINDINGS: The heart size and mediastinal contours are within normal limits.
Both lungs are clear. No evidence of pneumothorax or pleural
effusion. The visualized skeletal structures are unremarkable.
IMPRESSION: Negative.  No active cardiopulmonary disease.

## 2018-09-22 ENCOUNTER — Encounter: Payer: Self-pay | Admitting: Family Medicine

## 2018-09-22 ENCOUNTER — Ambulatory Visit (INDEPENDENT_AMBULATORY_CARE_PROVIDER_SITE_OTHER): Payer: No Typology Code available for payment source | Admitting: Family Medicine

## 2018-09-22 VITALS — BP 122/78 | HR 88 | Temp 98.0°F | Ht 67.0 in | Wt 166.0 lb

## 2018-09-22 DIAGNOSIS — R0982 Postnasal drip: Secondary | ICD-10-CM | POA: Diagnosis not present

## 2018-09-22 DIAGNOSIS — K219 Gastro-esophageal reflux disease without esophagitis: Secondary | ICD-10-CM | POA: Diagnosis not present

## 2018-09-22 NOTE — Progress Notes (Signed)
Subjective:    Patient ID: Alex Hernandez, male    DOB: 06/21/1991, 28 y.o.   MRN: [id number]  HPI  Mr. Alex Hernandez is a 28 year old male who presents today for a concern that he experienced an allergic reaction to chocolate. He ate a chocolate muffin 5 days ago and he reports feeling that he noticed throat discomfort with swallowing food but not liquid. He described that his throat felt like he had "needles" in his throat. This improved and he states that he is now experiencing post nasal drip and symptoms of acid reflux that he does experience this when eating chocolate candy. He states he has been able to eat chocolate previously without difficulty but it can trigger reflux symptoms if he eats more which he usually does in the month of February.  Associated dry cough that has been mildly productive a "few" times. He denies fever, chills, sweats, N/V/D, rash, swelling of lips/tongue, SOB, itching, hives, ear pain, sinus pressure History of GERD present; he has been evaluated by GI previously with an upper GI. Upper GI 08/03/2013 which was noted GERD and recommended anti-reflux medication with lowest dose.   He has been monitoring his acid intake and triggers for reflux. Treatment with Nexium provides benefit. He does not take this daily and will only take when experiences symptoms of reflux. He states that this will often occur in February when he does eat chocolate.   He is not a smoker and does not drink ETOH. He drinks few sodas and does not drink coffee/caffeinated drinks.   Review of Systems  Constitutional: Negative for chills, fatigue and fever.  HENT: Positive for postnasal drip. Negative for congestion, drooling, ear discharge, ear pain, sinus pressure, sinus pain, sneezing, sore throat, trouble swallowing and voice change.   Eyes: Negative for itching.  Respiratory: Positive for cough. Negative for shortness of breath and wheezing.   Cardiovascular: Negative for chest pain and  palpitations.  Gastrointestinal: Negative for abdominal pain, constipation, diarrhea, nausea and vomiting.  Musculoskeletal: Negative for myalgias.  Skin: Negative for rash.  Neurological: Negative for dizziness, light-headedness and headaches.   Past Medical History:  Diagnosis Date  . Asthma   . GERD (gastroesophageal reflux disease)      Social History   Socioeconomic History  . Marital status: Single    Spouse name: Not on file  . Number of children: 0  . Years of education: Not on file  . Highest education level: Not on file  Occupational History  . Occupation: Archivistcollege student -- [employer]  Social Needs  . Financial resource strain: Not on file  . Food insecurity:    Worry: Not on file    Inability: Not on file  . Transportation needs:    Medical: Not on file    Non-medical: Not on file  Tobacco Use  . Smoking status: Never Smoker  . Smokeless tobacco: Never Used  Substance and Sexual Activity  . Alcohol use: No  . Drug use: No  . Sexual activity: Yes  Lifestyle  . Physical activity:    Days per week: Not on file    Minutes per session: Not on file  . Stress: Not on file  Relationships  . Social connections:    Talks on phone: Not on file    Gets together: Not on file    Attends religious service: Not on file    Active member of club or organization: Not on file    Attends meetings of  clubs or organizations: Not on file    Relationship status: Not on file  . Intimate partner violence:    Fear of current or ex partner: Not on file    Emotionally abused: Not on file    Physically abused: Not on file    Forced sexual activity: Not on file  Other Topics Concern  . Not on file  Social History Narrative  . Not on file    History reviewed. No pertinent surgical history.  Family History  Problem Relation Age of Onset  . Cholelithiasis Mother   . Asthma Mother   . Scoliosis Mother   . GER disease Mother   . Heart defect Father        MVP  . Cholecystitis  Maternal Aunt   . Diabetes Maternal Grandfather        borderline  . Peptic Ulcer Maternal Grandfather   . Lung cancer Maternal Uncle        great  . Prostate cancer Paternal Uncle   . Lung cancer Paternal Grandfather   . Kidney failure Paternal Grandmother   . Hypertension Paternal Grandmother   . Hypertension Maternal Grandmother   . Asthma Maternal Grandmother   . Asthma Brother     Allergies  Allergen Reactions  . Penicillins     REACTION: Hives    Current Outpatient Medications on File Prior to Visit  Medication Sig Dispense Refill  . Dextrose-Fructose-Sod Citrate (NAUZENE PO) Take by mouth.    . esomeprazole (NEXIUM) 40 MG capsule Take 1 capsule (40 mg total) by mouth daily at 12 noon. 30 capsule 6  . fluticasone (FLONASE) 50 MCG/ACT nasal spray Place 2 sprays into both nostrils daily. 16 g 6  . fluticasone furoate-vilanterol (BREO ELLIPTA) 100-25 MCG/INH AEPB Inhale 1 puff into the lungs daily. (Patient not taking: Reported on 09/22/2018) 1 each 5  . hyoscyamine (LEVSIN, ANASPAZ) 0.125 MG tablet Take 1-2 tablets (0.125-0.25 mg total) by mouth every 4 (four) hours as needed for up to 10 days. 60 tablet 3  . Ibuprofen (ADVIL) 200 MG CAPS Take by mouth.     No current facility-administered medications on file prior to visit.     BP 122/78 (BP Location: Left Arm, Patient Position: Sitting, Cuff Size: Normal)   Pulse 88   Temp 98 F (36.7 C) (Oral)   Ht 5\' 7"  (1.702 m)   Wt 166 lb (75.3 kg)   SpO2 97%   BMI 26.00 kg/m        Objective:   Physical Exam Constitutional:      Appearance: Normal appearance.  HENT:     Right Ear: Tympanic membrane normal.     Left Ear: Tympanic membrane normal.     Nose: Nose normal.     Mouth/Throat:     Mouth: Mucous membranes are moist.     Comments: Post nasal drip present Eyes:     General: No scleral icterus.    Pupils: Pupils are equal, round, and reactive to light.  Neck:     Musculoskeletal: Neck supple.    Cardiovascular:     Rate and Rhythm: Normal rate and regular rhythm.     Pulses: Normal pulses.     Heart sounds: Normal heart sounds.  Pulmonary:     Effort: Pulmonary effort is normal.     Breath sounds: Normal breath sounds.  Abdominal:     General: Abdomen is flat. Bowel sounds are normal.     Palpations: Abdomen is soft.  Musculoskeletal:  General: No swelling.  Lymphadenopathy:     Cervical: No cervical adenopathy.  Skin:    General: Skin is warm and dry.     Capillary Refill: Capillary refill takes less than 2 seconds.  Neurological:     Mental Status: He is alert.  Psychiatric:        Mood and Affect: Mood normal.        Behavior: Behavior normal.        Thought Content: Thought content normal.        Judgment: Judgment normal.        Assessment & Plan:  1. Gastroesophageal reflux disease without esophagitis Symptoms are most consistent with reflux trigger of chocolate. No further symptoms present, Nexium has provided benefit. History of reflux that is triggered with chocolate. He has not been taking Nexium daily. Advised avoidance of triggers and further advised him to document any symptoms of reflux and foods that may be associated.  Nexium once daily and advised follow up with PCP as he is due for routine care and lab work. Provided teaching regarding triggers to avoid and return precautions.  2. Post-nasal drip Allegra, Claritin, or Zyrtec can be used with flonase for symptoms of allergic rhinitis.   Follow up with PCP to schedule routine care and if no improvement with symptoms.  Roddie Mc, FNP-C

## 2018-09-22 NOTE — Patient Instructions (Signed)
It was a pleasure to meet you today!  Allegra, Claritin, or Zyrtec can be used with flonase for symptoms of post nasal drip.  Continue Nexium daily and avoid triggers of reflux.  Please schedule a follow up for routine care and lab work with your provider.  If symptoms do not improve, worsen, or new symptoms develop, follow up for further evaluation.   Food Choices for Gastroesophageal Reflux Disease, Adult When you have gastroesophageal reflux disease (GERD), the foods you eat and your eating habits are very important. Choosing the right foods can help ease your discomfort. Think about working with a nutrition specialist (dietitian) to help you make good choices. What are tips for following this plan?  Meals  Choose healthy foods that are low in fat, such as fruits, vegetables, whole grains, low-fat dairy products, and lean meat, fish, and poultry.  Eat small meals often instead of 3 large meals a day. Eat your meals slowly, and in a place where you are relaxed. Avoid bending over or lying down until 2-3 hours after eating.  Avoid eating meals 2-3 hours before bed.  Avoid drinking a lot of liquid with meals.  Cook foods using methods other than frying. Bake, grill, or broil food instead.  Avoid or limit: ? Chocolate. ? Peppermint or spearmint. ? Alcohol. ? Pepper. ? Black and decaffeinated coffee. ? Black and decaffeinated tea. ? Bubbly (carbonated) soft drinks. ? Caffeinated energy drinks and soft drinks.  Limit high-fat foods such as: ? Fatty meat or fried foods. ? Whole milk, cream, butter, or ice cream. ? Nuts and nut butters. ? Pastries, donuts, and sweets made with butter or shortening.  Avoid foods that cause symptoms. These foods may be different for everyone. Common foods that cause symptoms include: ? Tomatoes. ? Oranges, lemons, and limes. ? Peppers. ? Spicy food. ? Onions and garlic. ? Vinegar. Lifestyle  Maintain a healthy weight. Ask your doctor what  weight is healthy for you. If you need to lose weight, work with your doctor to do so safely.  Exercise for at least 30 minutes for 5 or more days each week, or as told by your doctor.  Wear loose-fitting clothes.  Do not smoke. If you need help quitting, ask your doctor.  Sleep with the head of your bed higher than your feet. Use a wedge under the mattress or blocks under the bed frame to raise the head of the bed. Summary  When you have gastroesophageal reflux disease (GERD), food and lifestyle choices are very important in easing your symptoms.  Eat small meals often instead of 3 large meals a day. Eat your meals slowly, and in a place where you are relaxed.  Limit high-fat foods such as fatty meat or fried foods.  Avoid bending over or lying down until 2-3 hours after eating.  Avoid peppermint and spearmint, caffeine, alcohol, and chocolate. This information is not intended to replace advice given to you by your health care provider. Make sure you discuss any questions you have with your health care provider. Document Released: 01/05/2012 Document Revised: 08/11/2016 Document Reviewed: 08/11/2016 Elsevier Interactive Patient Education  2019 [employer].

## 2019-02-15 ENCOUNTER — Other Ambulatory Visit: Payer: Self-pay

## 2019-02-15 DIAGNOSIS — Z20822 Contact with and (suspected) exposure to covid-19: Secondary | ICD-10-CM

## 2019-02-17 LAB — NOVEL CORONAVIRUS, NAA: SARS-CoV-2, NAA: NOT DETECTED

## 2019-05-30 ENCOUNTER — Other Ambulatory Visit: Payer: Self-pay

## 2019-05-30 DIAGNOSIS — Z20822 Contact with and (suspected) exposure to covid-19: Secondary | ICD-10-CM

## 2019-06-01 LAB — NOVEL CORONAVIRUS, NAA: SARS-CoV-2, NAA: NOT DETECTED

## 2019-06-02 ENCOUNTER — Telehealth: Payer: Self-pay | Admitting: General Practice

## 2019-06-02 NOTE — Telephone Encounter (Signed)
Negative COVID results given. Patient results "NOT Detected." Caller expressed understanding. ° °

## 2019-07-18 ENCOUNTER — Ambulatory Visit (INDEPENDENT_AMBULATORY_CARE_PROVIDER_SITE_OTHER): Payer: No Typology Code available for payment source | Admitting: Internal Medicine

## 2019-07-18 ENCOUNTER — Encounter: Payer: Self-pay | Admitting: Internal Medicine

## 2019-07-18 ENCOUNTER — Other Ambulatory Visit: Payer: Self-pay

## 2019-07-18 VITALS — BP 140/80 | HR 81 | Temp 99.5°F | Ht 67.0 in | Wt 166.0 lb

## 2019-07-18 DIAGNOSIS — Z23 Encounter for immunization: Secondary | ICD-10-CM

## 2019-07-18 DIAGNOSIS — G8929 Other chronic pain: Secondary | ICD-10-CM | POA: Diagnosis not present

## 2019-07-18 DIAGNOSIS — M25572 Pain in left ankle and joints of left foot: Secondary | ICD-10-CM | POA: Diagnosis not present

## 2019-07-18 DIAGNOSIS — Z0000 Encounter for general adult medical examination without abnormal findings: Secondary | ICD-10-CM | POA: Diagnosis not present

## 2019-07-18 LAB — BASIC METABOLIC PANEL
BUN: 10 mg/dL (ref 6–23)
CO2: 30 mEq/L (ref 19–32)
Calcium: 9.5 mg/dL (ref 8.4–10.5)
Chloride: 100 mEq/L (ref 96–112)
Creatinine, Ser: 1.17 mg/dL (ref 0.40–1.50)
GFR: 89.66 mL/min (ref >60.00)
Glucose, Bld: 85 mg/dL (ref 70–99)
Potassium: 3.9 mEq/L (ref 3.5–5.1)
Sodium: 137 mEq/L (ref 135–145)

## 2019-07-18 LAB — HEPATIC FUNCTION PANEL
ALT: 15 U/L (ref 0–53)
AST: 15 U/L (ref 0–37)
Albumin: 4.4 g/dL (ref 3.5–5.2)
Alkaline Phosphatase: 51 U/L (ref 39–117)
Bilirubin, Direct: 0.1 mg/dL (ref 0.0–0.3)
Total Bilirubin: 0.8 mg/dL (ref 0.2–1.2)
Total Protein: 7.1 g/dL (ref 6.0–8.3)

## 2019-07-18 LAB — CBC WITH DIFFERENTIAL/PLATELET
Basophils Absolute: 0 10*3/uL (ref 0.0–0.1)
Basophils Relative: 0.5 % (ref 0.0–3.0)
Eosinophils Absolute: 0 10*3/uL (ref 0.0–0.7)
Eosinophils Relative: 0.6 % (ref 0.0–5.0)
HCT: 44 % (ref 39.0–52.0)
Hemoglobin: 14.6 g/dL (ref 13.0–17.0)
Lymphocytes Relative: 30.1 % (ref 12.0–46.0)
Lymphs Abs: 1.7 10*3/uL (ref 0.7–4.0)
MCHC: 33.2 g/dL (ref 30.0–36.0)
MCV: 89.9 fl (ref 78.0–100.0)
Monocytes Absolute: 0.3 10*3/uL (ref 0.1–1.0)
Monocytes Relative: 6.1 % (ref 3.0–12.0)
Neutro Abs: 3.5 10*3/uL (ref 1.4–7.7)
Neutrophils Relative %: 62.7 % (ref 43.0–77.0)
Platelets: 161 10*3/uL (ref 150.0–400.0)
RBC: 4.89 Mil/uL (ref 4.22–5.81)
RDW: 12.8 % (ref 11.5–15.5)
WBC: 5.6 10*3/uL (ref 4.0–10.5)

## 2019-07-18 LAB — URINALYSIS
Bilirubin Urine: NEGATIVE
Ketones, ur: NEGATIVE
Leukocytes,Ua: NEGATIVE
Nitrite: NEGATIVE
Specific Gravity, Urine: 1.02 (ref 1.000–1.030)
Total Protein, Urine: NEGATIVE
Urine Glucose: NEGATIVE
Urobilinogen, UA: 0.2 (ref 0.0–1.0)
pH: 6.5 (ref 5.0–8.0)

## 2019-07-18 LAB — LIPID PANEL
Cholesterol: 207 mg/dL — ABNORMAL HIGH (ref 0–200)
HDL: 40.9 mg/dL (ref >39.00)
LDL Cholesterol: 143 mg/dL — ABNORMAL HIGH (ref 0–99)
NonHDL: 166.48
Total CHOL/HDL Ratio: 5
Triglycerides: 117 mg/dL (ref 0.0–149.0)
VLDL: 23.4 mg/dL (ref 0.0–40.0)

## 2019-07-18 LAB — TSH: TSH: 1.63 u[IU]/mL (ref 0.35–4.50)

## 2019-07-18 MED ORDER — VITAMIN D3 50 MCG (2000 UT) PO CAPS: 2000.0000 [IU] | ORAL_CAPSULE | Freq: Every day | ORAL | 3 refills | Status: AC

## 2019-07-18 NOTE — Assessment & Plan Note (Addendum)
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

## 2019-07-18 NOTE — Assessment & Plan Note (Addendum)
We discussed age appropriate health related issues, including available/recomended screening tests and vaccinations. We discussed a need for adhering to healthy diet and exercise. Labs were ordered to be later reviewed . All questions were answered. Testes self exam Not active sexually Declined HIV test tDAP All vaccines were update for [facility] employment Eye exam TB test - Gold test Vit D

## 2019-07-18 NOTE — Progress Notes (Signed)
Subjective:  Patient ID: Alex Hernandez, male    DOB: 28-Jun-1991  Age: 28 y.o. MRN: [id number]  CC: No chief complaint on file.   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 Dextrose-Fructose-Sod Citrate (NAUZENE PO) Take by mouth.    . esomeprazole (NEXIUM) 40 MG capsule Take 1 capsule (40 mg total) by mouth daily at 12 noon. 30 capsule 6  . fluticasone (FLONASE) 50 MCG/ACT nasal spray Place 2 sprays into both nostrils daily. 16 g 6  . fluticasone furoate-vilanterol (BREO ELLIPTA) 100-25 MCG/INH AEPB Inhale 1 puff into the lungs daily. 1 each 5  . Ibuprofen (ADVIL) 200 MG CAPS Take by mouth.    . hyoscyamine (LEVSIN, ANASPAZ) 0.125 MG tablet Take 1-2 tablets (0.125-0.25 mg total) by mouth every 4 (four) hours as needed for up to 10 days. 60 tablet 3   No facility-administered medications prior to visit.    ROS: Review of Systems  Constitutional: Negative for appetite change, fatigue and unexpected weight change.  HENT: Negative for congestion, nosebleeds, sneezing, sore throat and trouble swallowing.   Eyes: Negative for itching and visual disturbance.  Respiratory: Negative for cough.   Cardiovascular: Negative for chest pain, palpitations and leg swelling.  Gastrointestinal: Negative for abdominal distention, blood in stool, diarrhea and nausea.  Genitourinary: Negative for frequency and hematuria.  Musculoskeletal: Negative for back pain, gait problem, joint swelling and neck pain.  Skin: Negative for rash.  Neurological: Negative for dizziness, tremors, speech difficulty and weakness.  Psychiatric/Behavioral: Negative for agitation, dysphoric mood and sleep disturbance. The patient is not nervous/anxious.     Objective:  BP 140/80   Pulse 81   Temp 99.5 F (37.5 C) (Oral)   Ht 5\' 7"  (1.702 m)    Wt 166 lb (75.3 kg)   SpO2 97%   BMI 26.00 kg/m   BP Readings from Last 3 Encounters:  07/18/19 140/80  09/22/18 122/78  09/15/17 122/80    Wt Readings from Last 3 Encounters:  07/18/19 166 lb (75.3 kg)  09/22/18 166 lb (75.3 kg)  09/15/17 171 lb (77.6 kg)    Physical Exam Constitutional:      General: He is not in acute distress.    Appearance: He is well-developed.     Comments: NAD  Eyes:     Conjunctiva/sclera: Conjunctivae normal.     Pupils: Pupils are equal, round, and reactive to light.  Neck:     Thyroid: No thyromegaly.     Vascular: No JVD.  Cardiovascular:     Rate and Rhythm: Normal rate and regular rhythm.     Heart sounds: Normal heart sounds. No murmur. No friction rub. No gallop.   Pulmonary:     Effort: Pulmonary effort is normal. No respiratory distress.     Breath sounds: Normal breath sounds. No wheezing or rales.  Chest:     Chest wall: No tenderness.  Abdominal:     General: Bowel sounds are normal. There is no distension.     Palpations: Abdomen is soft. There is no mass.     Tenderness: There is no abdominal tenderness. There is no guarding or rebound.  Musculoskeletal:        General: No tenderness. Normal range of motion.     Cervical back: Normal range of motion.  Lymphadenopathy:     Cervical: No cervical adenopathy.  Skin:    General: Skin is warm and dry.     Findings: No rash.  Neurological:     Mental Status: He is alert and oriented to person, place, and time.     Cranial Nerves: No cranial nerve deficit.     Motor: No abnormal muscle tone.     Coordination: Coordination normal.     Gait: Gait normal.     Deep Tendon Reflexes: Reflexes are normal and symmetric.  Psychiatric:        Behavior: Behavior normal.        Thought Content: Thought content normal.        Judgment: Judgment normal.   L>R flat feet L medial foot/ankle tender Testes - self exam  Lab Results  Component Value Date   WBC 7.0 08/16/2014   HGB 14.3  08/16/2014   HCT 41.6 08/16/2014   PLT 174 08/16/2014   GLUCOSE 105 (H) 08/16/2014   CHOL 156 12/02/2009   TRIG 61.0 12/02/2009   HDL 46.00 12/02/2009   LDLCALC 98 12/02/2009   ALT 22 08/16/2014   AST 25 08/16/2014   NA 138 08/16/2014   K 4.1 08/16/2014   CL 102 08/16/2014   CREATININE 1.14 08/16/2014   BUN 8 08/16/2014   CO2 32 08/16/2014    DG Chest 2 View  Result Date: 05/12/2016 CLINICAL DATA:  Cough for 1 month. Asthma. Gastroesophageal reflux disease. EXAM: CHEST  2 VIEW COMPARISON:  None. FINDINGS: The heart size and mediastinal contours are within normal limits. Both lungs are clear. No evidence of pneumothorax or pleural effusion. The visualized skeletal structures are unremarkable. IMPRESSION: Negative.  No active cardiopulmonary disease. Electronically Signed   By: Earle Gell M.D.   On: 05/12/2016 11:04    Assessment & Plan:   There are no diagnoses linked to this encounter.   No orders of the defined types were placed in this encounter.    Follow-up: No follow-ups on file.  Walker Kehr, MD

## 2019-07-22 LAB — QUANTIFERON-TB GOLD PLUS
Mitogen-NIL: >10 IU/mL
NIL: 0.05 IU/mL
QuantiFERON-TB Gold Plus: NEGATIVE
TB1-NIL: 0.01 IU/mL
TB2-NIL: 0 IU/mL

## 2019-10-30 ENCOUNTER — Ambulatory Visit: Payer: No Typology Code available for payment source | Admitting: Family Medicine

## 2020-07-17 ENCOUNTER — Other Ambulatory Visit: Payer: Self-pay

## 2020-07-18 ENCOUNTER — Ambulatory Visit (INDEPENDENT_AMBULATORY_CARE_PROVIDER_SITE_OTHER): Payer: BC Managed Care – PPO | Admitting: Internal Medicine

## 2020-07-18 ENCOUNTER — Encounter: Payer: Self-pay | Admitting: Internal Medicine

## 2020-07-18 DIAGNOSIS — E739 Lactose intolerance, unspecified: Secondary | ICD-10-CM

## 2020-07-18 DIAGNOSIS — G8929 Other chronic pain: Secondary | ICD-10-CM

## 2020-07-18 DIAGNOSIS — Z0000 Encounter for general adult medical examination without abnormal findings: Secondary | ICD-10-CM | POA: Diagnosis not present

## 2020-07-18 DIAGNOSIS — M545 Low back pain, unspecified: Secondary | ICD-10-CM | POA: Insufficient documentation

## 2020-07-18 LAB — URINALYSIS
Bilirubin Urine: NEGATIVE
Hgb urine dipstick: NEGATIVE
Ketones, ur: NEGATIVE
Leukocytes,Ua: NEGATIVE
Nitrite: NEGATIVE
Specific Gravity, Urine: 1.02 (ref 1.000–1.030)
Total Protein, Urine: NEGATIVE
Urine Glucose: NEGATIVE
Urobilinogen, UA: 0.2 (ref 0.0–1.0)
pH: 6.5 (ref 5.0–8.0)

## 2020-07-18 LAB — COMPREHENSIVE METABOLIC PANEL
ALT: 31 U/L (ref 0–53)
AST: 20 U/L (ref 0–37)
Albumin: 4.5 g/dL (ref 3.5–5.2)
Alkaline Phosphatase: 55 U/L (ref 39–117)
BUN: 13 mg/dL (ref 6–23)
CO2: 31 mEq/L (ref 19–32)
Calcium: 9.3 mg/dL (ref 8.4–10.5)
Chloride: 104 mEq/L (ref 96–112)
Creatinine, Ser: 1.24 mg/dL (ref 0.40–1.50)
GFR: 78.71 mL/min (ref >60.00)
Glucose, Bld: 94 mg/dL (ref 70–99)
Potassium: 4.1 mEq/L (ref 3.5–5.1)
Sodium: 140 mEq/L (ref 135–145)
Total Bilirubin: 0.5 mg/dL (ref 0.2–1.2)
Total Protein: 7.6 g/dL (ref 6.0–8.3)

## 2020-07-18 LAB — CBC WITH DIFFERENTIAL/PLATELET
Basophils Absolute: 0 10*3/uL (ref 0.0–0.1)
Basophils Relative: 0.3 % (ref 0.0–3.0)
Eosinophils Absolute: 0.1 10*3/uL (ref 0.0–0.7)
Eosinophils Relative: 1 % (ref 0.0–5.0)
HCT: 43.1 % (ref 39.0–52.0)
Hemoglobin: 14.5 g/dL (ref 13.0–17.0)
Lymphocytes Relative: 30 % (ref 12.0–46.0)
Lymphs Abs: 2.4 10*3/uL (ref 0.7–4.0)
MCHC: 33.6 g/dL (ref 30.0–36.0)
MCV: 88.2 fl (ref 78.0–100.0)
Monocytes Absolute: 0.5 10*3/uL (ref 0.1–1.0)
Monocytes Relative: 6.5 % (ref 3.0–12.0)
Neutro Abs: 4.9 10*3/uL (ref 1.4–7.7)
Neutrophils Relative %: 62.2 % (ref 43.0–77.0)
Platelets: 191 10*3/uL (ref 150.0–400.0)
RBC: 4.89 Mil/uL (ref 4.22–5.81)
RDW: 12.6 % (ref 11.5–15.5)
WBC: 7.9 10*3/uL (ref 4.0–10.5)

## 2020-07-18 LAB — LIPID PANEL
Cholesterol: 180 mg/dL (ref 0–200)
HDL: 43.5 mg/dL (ref >39.00)
LDL Cholesterol: 107 mg/dL — ABNORMAL HIGH (ref 0–99)
NonHDL: 136.88
Total CHOL/HDL Ratio: 4
Triglycerides: 150 mg/dL — ABNORMAL HIGH (ref 0.0–149.0)
VLDL: 30 mg/dL (ref 0.0–40.0)

## 2020-07-18 LAB — TSH: TSH: 2.01 u[IU]/mL (ref 0.35–4.50)

## 2020-07-18 NOTE — Progress Notes (Signed)
Subjective:  Patient ID: Alex Hernandez, male    DOB: [birth year]  Age: 29 y.o. MRN: [id number]  CC: Annual Exam   HPI Alex N Schreckengost presents for a well exam  Outpatient Medications Prior to Visit  Medication Sig Dispense Refill  . Cholecalciferol (VITAMIN D3) 50 MCG (2000 UT) capsule Take 1 capsule (2,000 Units total) by mouth daily. 100 capsule 3  . Dextrose-Fructose-Sod Citrate (NAUZENE PO) Take by mouth.    . esomeprazole (NEXIUM) 40 MG capsule Take 1 capsule (40 mg total) by mouth daily at 12 noon. 30 capsule 6  . fluticasone (FLONASE) 50 MCG/ACT nasal spray Place 2 sprays into both nostrils daily. 16 g 6  . fluticasone furoate-vilanterol (BREO ELLIPTA) 100-25 MCG/INH AEPB Inhale 1 puff into the lungs daily. 1 each 5  . Ibuprofen 200 MG CAPS Take by mouth.    . hyoscyamine (LEVSIN, ANASPAZ) 0.125 MG tablet Take 1-2 tablets (0.125-0.25 mg total) by mouth every 4 (four) hours as needed for up to 10 days. 60 tablet 3   No facility-administered medications prior to visit.    ROS: Review of Systems  Constitutional: Negative for appetite change, fatigue and unexpected weight change.  HENT: Negative for congestion, nosebleeds, sneezing, sore throat and trouble swallowing.   Eyes: Negative for itching and visual disturbance.  Respiratory: Negative for cough.   Cardiovascular: Negative for chest pain, palpitations and leg swelling.  Gastrointestinal: Negative for abdominal distention, blood in stool, diarrhea and nausea.  Genitourinary: Negative for frequency and hematuria.  Musculoskeletal: Positive for back pain. Negative for gait problem, joint swelling and neck pain.  Skin: Negative for rash.  Neurological: Negative for dizziness, tremors, speech difficulty and weakness.  Psychiatric/Behavioral: Negative for agitation, dysphoric mood and sleep disturbance. The patient is not nervous/anxious.     Objective:  BP (!) 136/94   Pulse 84   Temp 99.3 F (37.4 C) (Oral)   Ht  5\' 7"  (1.702 m)   Wt 171 lb (77.6 kg)   SpO2 98%   BMI 26.78 kg/m   BP Readings from Last 3 Encounters:  07/18/20 (!) 136/94  07/18/19 140/80  09/22/18 122/78    Wt Readings from Last 3 Encounters:  07/18/20 171 lb (77.6 kg)  07/18/19 166 lb (75.3 kg)  09/22/18 166 lb (75.3 kg)    Physical Exam Constitutional:      General: He is not in acute distress.    Appearance: He is well-developed.     Comments: NAD  HENT:     Mouth/Throat:     Mouth: Oropharynx is clear and moist.  Eyes:     Conjunctiva/sclera: Conjunctivae normal.     Pupils: Pupils are equal, round, and reactive to light.  Neck:     Thyroid: No thyromegaly.     Vascular: No JVD.  Cardiovascular:     Rate and Rhythm: Normal rate and regular rhythm.     Pulses: Intact distal pulses.     Heart sounds: Normal heart sounds. No murmur heard. No friction rub. No gallop.   Pulmonary:     Effort: Pulmonary effort is normal. No respiratory distress.     Breath sounds: Normal breath sounds. No wheezing or rales.  Chest:     Chest wall: No tenderness.  Abdominal:     General: Bowel sounds are normal. There is no distension.     Palpations: Abdomen is soft. There is no mass.     Tenderness: There is no abdominal tenderness. There is no guarding  or rebound.  Musculoskeletal:        General: No tenderness or edema. Normal range of motion.     Cervical back: Normal range of motion.  Lymphadenopathy:     Cervical: No cervical adenopathy.  Skin:    General: Skin is warm and dry.     Findings: No rash.  Neurological:     Mental Status: He is alert and oriented to person, place, and time.     Cranial Nerves: No cranial nerve deficit.     Motor: No abnormal muscle tone.     Coordination: He displays a negative Romberg sign. Coordination normal.     Gait: Gait normal.     Deep Tendon Reflexes: Reflexes are normal and symmetric.  Psychiatric:        Mood and Affect: Mood and affect normal.        Behavior: Behavior  normal.        Thought Content: Thought content normal.        Judgment: Judgment normal.     Lab Results  Component Value Date   WBC 5.6 07/18/2019   HGB 14.6 07/18/2019   HCT 44.0 07/18/2019   PLT 161.0 07/18/2019   GLUCOSE 85 07/18/2019   CHOL 207 (H) 07/18/2019   TRIG 117.0 07/18/2019   HDL 40.90 07/18/2019   LDLCALC 143 (H) 07/18/2019   ALT 15 07/18/2019   AST 15 07/18/2019   NA 137 07/18/2019   K 3.9 07/18/2019   CL 100 07/18/2019   CREATININE 1.17 07/18/2019   BUN 10 07/18/2019   CO2 30 07/18/2019   TSH 1.63 07/18/2019    DG Chest 2 View  Result Date: 05/12/2016 CLINICAL DATA:  Cough for 1 month. Asthma. Gastroesophageal reflux disease. EXAM: CHEST  2 VIEW COMPARISON:  None. FINDINGS: The heart size and mediastinal contours are within normal limits. Both lungs are clear. No evidence of pneumothorax or pleural effusion. The visualized skeletal structures are unremarkable. IMPRESSION: Negative.  No active cardiopulmonary disease. Electronically Signed   By: Myles Rosenthal M.D.   On: 05/12/2016 11:04    Assessment & Plan:    Sonda Primes, MD

## 2020-07-18 NOTE — Assessment & Plan Note (Signed)
  We discussed age appropriate health related issues, including available/recomended screening tests and vaccinations. Labs were ordered to be later reviewed . All questions were answered. We discussed one or more of the following - seat belt use, use of sunscreen/sun exposure exercise, safe sex, fall risk reduction, second hand smoke exposure, firearm use and storage, seat belt use, a need for adhering to healthy diet and exercise. Labs were ordered.  All questions were answered.  Restart Vit D

## 2020-07-18 NOTE — Assessment & Plan Note (Signed)
Alex Hernandez is getting a new matrass Re-start Vit D
